# Patient Record
Sex: Male | Born: 1995 | Race: White | Hispanic: No | Marital: Single | Smoking: Light tobacco smoker
Health system: Southern US, Community
[De-identification: ages and names within clinical notes are randomized; demographics above are authoritative.]

## PROBLEM LIST (undated history)

## (undated) DIAGNOSIS — K219 Gastro-esophageal reflux disease without esophagitis: Secondary | ICD-10-CM

## (undated) DIAGNOSIS — K589 Irritable bowel syndrome without diarrhea: Secondary | ICD-10-CM

## (undated) DIAGNOSIS — F419 Anxiety disorder, unspecified: Secondary | ICD-10-CM

## (undated) DIAGNOSIS — I10 Essential (primary) hypertension: Secondary | ICD-10-CM

## (undated) HISTORY — PX: FEMUR SURGERY: SHX943

## (undated) HISTORY — PX: WISDOM TOOTH EXTRACTION: SHX21

## (undated) HISTORY — PX: FEMUR HARDWARE REMOVAL: SUR1124

## (undated) HISTORY — PX: FRACTURE SURGERY: SHX138

## (undated) HISTORY — PX: APPENDECTOMY: SHX54

## (undated) HISTORY — DX: Essential (primary) hypertension: I10

---
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
## 2015-10-29 ENCOUNTER — Observation Stay
Admission: EM | Admit: 2015-10-29 | Discharge: 2015-10-30 | Disposition: A | Discharge status: Home / Self Care | Payer: BLUE CROSS/BLUE SHIELD | Attending: General Surgery | Admitting: General Surgery

## 2015-10-29 ENCOUNTER — Emergency Department: Payer: BLUE CROSS/BLUE SHIELD

## 2015-10-29 ENCOUNTER — Encounter: Payer: Self-pay | Admitting: Emergency Medicine

## 2015-10-29 DIAGNOSIS — K3580 Unspecified acute appendicitis: Secondary | ICD-10-CM | POA: Diagnosis present

## 2015-10-29 DIAGNOSIS — R1010 Upper abdominal pain, unspecified: Principal | ICD-10-CM | POA: Insufficient documentation

## 2015-10-29 DIAGNOSIS — R1012 Left upper quadrant pain: Secondary | ICD-10-CM | POA: Diagnosis not present

## 2015-10-29 DIAGNOSIS — R109 Unspecified abdominal pain: Secondary | ICD-10-CM | POA: Diagnosis present

## 2015-10-29 LAB — URINALYSIS COMPLETE WITH MICROSCOPIC (ARMC ONLY)
BILIRUBIN URINE: NEGATIVE
Bacteria, UA: NONE SEEN
GLUCOSE, UA: NEGATIVE mg/dL
HGB URINE DIPSTICK: NEGATIVE
KETONES UR: NEGATIVE mg/dL
Leukocytes, UA: NEGATIVE
NITRITE: NEGATIVE
Protein, ur: NEGATIVE mg/dL
SPECIFIC GRAVITY, URINE: 1.016 (ref 1.005–1.030)
Squamous Epithelial / LPF: NONE SEEN
pH: 7 (ref 5.0–8.0)

## 2015-10-29 LAB — CBC WITH DIFFERENTIAL/PLATELET
Basophils Absolute: 0 10*3/uL (ref 0–0.1)
Basophils Relative: 0 %
EOS ABS: 0.1 10*3/uL (ref 0–0.7)
EOS PCT: 1 %
HCT: 49.4 % (ref 40.0–52.0)
Hemoglobin: 16.4 g/dL (ref 13.0–18.0)
LYMPHS ABS: 1.4 10*3/uL (ref 1.0–3.6)
Lymphocytes Relative: 13 %
MCH: 28.5 pg (ref 26.0–34.0)
MCHC: 33.2 g/dL (ref 32.0–36.0)
MCV: 85.6 fL (ref 80.0–100.0)
MONOS PCT: 5 %
Monocytes Absolute: 0.5 10*3/uL (ref 0.2–1.0)
Neutro Abs: 9 10*3/uL — ABNORMAL HIGH (ref 1.4–6.5)
Neutrophils Relative %: 81 %
PLATELETS: 263 10*3/uL (ref 150–440)
RBC: 5.77 MIL/uL (ref 4.40–5.90)
RDW: 13.5 % (ref 11.5–14.5)
WBC: 11.1 10*3/uL — ABNORMAL HIGH (ref 3.8–10.6)

## 2015-10-29 LAB — COMPREHENSIVE METABOLIC PANEL
ALK PHOS: 64 U/L (ref 38–126)
ALT: 27 U/L (ref 17–63)
ANION GAP: 7 (ref 5–15)
AST: 19 U/L (ref 15–41)
Albumin: 4.8 g/dL (ref 3.5–5.0)
BUN: 10 mg/dL (ref 6–20)
CALCIUM: 9.7 mg/dL (ref 8.9–10.3)
CHLORIDE: 103 mmol/L (ref 101–111)
CO2: 29 mmol/L (ref 22–32)
Creatinine, Ser: 0.93 mg/dL (ref 0.61–1.24)
GFR calc non Af Amer: >60 mL/min (ref >60)
Glucose, Bld: 93 mg/dL (ref 65–99)
Potassium: 3.9 mmol/L (ref 3.5–5.1)
SODIUM: 139 mmol/L (ref 135–145)
Total Bilirubin: 0.7 mg/dL (ref 0.3–1.2)
Total Protein: 8.6 g/dL — ABNORMAL HIGH (ref 6.5–8.1)

## 2015-10-29 LAB — LIPASE, BLOOD: LIPASE: 23 U/L (ref 11–51)

## 2015-10-29 MED ORDER — DIPHENHYDRAMINE HCL 25 MG PO CAPS: 25.0000 mg | ORAL_CAPSULE | Freq: Four times a day (QID) | ORAL | Status: DC | PRN

## 2015-10-29 MED ORDER — ONDANSETRON HCL 4 MG/2ML IJ SOLN: 4.0000 mg | Freq: Four times a day (QID) | INTRAMUSCULAR | Status: DC | PRN

## 2015-10-29 MED ORDER — MORPHINE SULFATE (PF) 4 MG/ML IV SOLN: 4.0000 mg | INTRAVENOUS | Status: DC | PRN

## 2015-10-29 MED ORDER — LACTATED RINGERS IV SOLN
INTRAVENOUS | Status: DC
  Administered 2015-10-29 – 2015-10-30 (×2): via INTRAVENOUS

## 2015-10-29 MED ORDER — ONDANSETRON 4 MG PO TBDP: 4.0000 mg | ORAL_TABLET | Freq: Four times a day (QID) | ORAL | Status: DC | PRN

## 2015-10-29 MED ORDER — DIPHENHYDRAMINE HCL 50 MG/ML IJ SOLN: 25.0000 mg | Freq: Four times a day (QID) | INTRAMUSCULAR | Status: DC | PRN

## 2015-10-29 NOTE — ED Notes (Signed)
Patient transported to CT 
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
## 2015-10-29 NOTE — ED Notes (Signed)
Pt with co upper abd pain, no n.v.d

## 2015-10-29 NOTE — ED Notes (Signed)
Upper abd pain/cramping. Denies nausea or vomiting.

## 2015-10-29 NOTE — ED Provider Notes (Signed)
[facility] Emergency Department Provider Note  ____________________________________________  Time seen: On arrival  I have reviewed the triage vital signs and the nursing notes.   HISTORY  Chief Complaint Abdominal Pain    HPI Johnny Stein is a 19 y.o. male who presents with left upper quadrant abdominal pain which started approximately 2-3 hours prior to arrival. He reports abrupt cramping discomfort which is now mostly resolved. He denies fevers chills. No nausea no vomiting. No diarrhea. No history of kidney stones. No scrotal pain. He has not taken anything for the pain and it has improved significantly     No past medical history on file.  There are no active problems to display for this patient.   No past surgical history on file.  No current outpatient prescriptions on file.  Allergies Review of patient's allergies indicates no known allergies.  No family history on file.  Social History Social History  Substance Use Topics  . Smoking status: Not on file  . Smokeless tobacco: Not on file  . Alcohol Use: Not on file    Review of Systems  Constitutional: Negative for fever. Eyes: Negative for visual changes. ENT: Negative for sore throat Cardiovascular: Negative for chest pain. Respiratory: Negative for shortness of breath. Gastrointestinal: Negative for vomiting and diarrhea. Genitourinary: Negative for dysuria. Musculoskeletal: Negative for back pain. Skin: Negative for rash. Neurological: Negative for headaches or focal weakness Psychiatric: Mild anxiety    ____________________________________________   PHYSICAL EXAM:  VITAL SIGNS: ED Triage Vitals  Enc Vitals Group     BP 10/29/15 1930 153/88 mmHg     Pulse Rate 10/29/15 1930 67     Resp 10/29/15 1930 18     Temp 10/29/15 1930 98.6 F (37 C)     Temp Source 10/29/15 1930 Oral     SpO2 10/29/15 1930 100 %     Weight 10/29/15 1930 245 lb (111.131 kg)   Height 10/29/15 1930 6' (1.829 m)     Head Cir --      Peak Flow --      Pain Score 10/29/15 1930 5     Pain Loc --      Pain Edu? --      Excl. in GC? --      Constitutional: Alert and oriented. Well appearing and in no distress. Eyes: Conjunctivae are normal.  ENT   Head: Normocephalic and atraumatic.   Mouth/Throat: Mucous membranes are moist. Cardiovascular: Normal rate, regular rhythm. Normal and symmetric distal pulses are present in all extremities. No murmurs, rubs, or gallops. Respiratory: Normal respiratory effort without tachypnea nor retractions. Breath sounds are clear and equal bilaterally.  Gastrointestinal: Soft and non-tender in all quadrants. No distention. There is no CVA tenderness. Genitourinary: deferred Musculoskeletal: Nontender with normal range of motion in all extremities. No lower extremity tenderness nor edema. Neurologic:  Normal speech and language. No gross focal neurologic deficits are appreciated. Skin:  Skin is warm, dry and intact. No rash noted. Psychiatric: Mood and affect are normal. Patient exhibits appropriate insight and judgment.  ____________________________________________    LABS (pertinent positives/negatives)  Labs Reviewed  CBC WITH DIFFERENTIAL/PLATELET - Abnormal; Notable for the following:    WBC 11.1 (*)    Neutro Abs 9.0 (*)    All other components within normal limits  COMPREHENSIVE METABOLIC PANEL - Abnormal; Notable for the following:    Total Protein 8.6 (*)    All other components within normal limits  URINALYSIS COMPLETEWITH MICROSCOPIC [facility]  ONLY) - Abnormal; Notable for the following:    Color, Urine YELLOW (*)    APPearance CLEAR (*)    All other components within normal limits  LIPASE, BLOOD    ____________________________________________   EKG  None  ____________________________________________    RADIOLOGY I have personally reviewed any xrays that were ordered on this patient: CT abdomen  pelvis concerning for appendicitis  ____________________________________________   PROCEDURES  Procedure(s) performed: none  Critical Care performed: none  ____________________________________________   INITIAL IMPRESSION / ASSESSMENT AND PLAN / ED COURSE  Pertinent labs & imaging results that were available during my care of the patient were reviewed by me and considered in my medical decision making (see chart for details).  Patient overall well-appearing with benign exam. Differential includes gastritis, pancreatitis, nonspecific abdominal cramping, kidney stones. We'll check CT renal stone study and labs and urine and reevaluate  Despite location of patient's pain, CT abdomen pelvis is concerning for appendicitis. I have consulted Dr. Tonita Cong of general surgery to evaluate the patient.  ____________________________________________   FINAL CLINICAL IMPRESSION(S) / ED DIAGNOSES  Final diagnoses:  Flank pain, acute  Acute appendicitis, unspecified acute appendicitis type     Jene Every, MD 10/29/15 2213

## 2015-10-30 DIAGNOSIS — R1012 Left upper quadrant pain: Secondary | ICD-10-CM

## 2015-10-30 LAB — BASIC METABOLIC PANEL
Anion gap: 3 — ABNORMAL LOW (ref 5–15)
BUN: 11 mg/dL (ref 6–20)
CHLORIDE: 106 mmol/L (ref 101–111)
CO2: 32 mmol/L (ref 22–32)
CREATININE: 1.09 mg/dL (ref 0.61–1.24)
Calcium: 9 mg/dL (ref 8.9–10.3)
GFR calc Af Amer: >60 mL/min (ref >60)
GFR calc non Af Amer: >60 mL/min (ref >60)
GLUCOSE: 99 mg/dL (ref 65–99)
Potassium: 3.2 mmol/L — ABNORMAL LOW (ref 3.5–5.1)
Sodium: 141 mmol/L (ref 135–145)

## 2015-10-30 LAB — CBC
HEMATOCRIT: 45.8 % (ref 40.0–52.0)
HEMOGLOBIN: 15.3 g/dL (ref 13.0–18.0)
MCH: 28.3 pg (ref 26.0–34.0)
MCHC: 33.3 g/dL (ref 32.0–36.0)
MCV: 84.9 fL (ref 80.0–100.0)
Platelets: 215 10*3/uL (ref 150–440)
RBC: 5.39 MIL/uL (ref 4.40–5.90)
RDW: 13.4 % (ref 11.5–14.5)
WBC: 9.1 10*3/uL (ref 3.8–10.6)

## 2015-10-30 NOTE — Discharge Summary (Signed)
Patient ID: Dickey GaveMichael D Kohlbeck MRN: [id number] DOB/AGE: 20-Apr-1996 20 y.o.  Admit date: 10/29/2015 Discharge date: 10/30/2015  Discharge Diagnoses:  Abdominal pain  Procedures Performed: None  Discharged Condition: good  Hospital Course: Patient placed in observation from the emergency department for evaluation of abdominal pain. Patient's pain resolved and labs normalized overnight. No evidence of appendicitis on physical exam. Able tolerate a regular diet prior to discharge.  Discharge Orders:  discharge home. Should symptoms recur and not spontaneously resolve please contact the clinic. If unable to handle the pain after hours report to emergency department.  Disposition: Discharge home  Discharge Medications: No new medications for discharge   Follwup: Follow-up Information    Schedule an appointment as needed for a [facility] [place].   Specialty:  General Surgery   Why:  As needed for recurrent symptoms    Contact information:   [address], [address] [place] [id number] [phone]      Signed: Ricarda FrameCharles Harneet Noblett 10/30/2015, 12:23 PM

## 2015-10-30 NOTE — Progress Notes (Signed)
Alert and oriented. Vss. No signs of acute distress. No nausea and vomiting. Denies pain and discomfort. Discharge instructions given. Patient verbalized understanding.

## 2015-10-30 NOTE — Progress Notes (Addendum)
Patient A/O, no noted distress. Patient noted his pain is at a four, "no I don't anything for pain."  No noted open areas on skin. LBM 10/29/15. Staff will continue to meet needs and monitor. Dr. Tonita CongWoodham noted, if patient tolerates meal he will be discharge.

## 2015-10-30 NOTE — Discharge Instructions (Signed)

## 2015-10-30 NOTE — Progress Notes (Signed)
CC: Abdominal pain Subjective: Patient reports that his pain did not return overnight. Denies any nausea, vomiting. He slept through the night.  Objective: Vital signs in last 24 hours: Temp:  [97.4 F (36.3 C)-98.6 F (37 C)] 97.4 F (36.3 C) (12/16 0529) Pulse Rate:  [59-89] 59 (12/16 0529) Resp:  [16-18] 16 (12/16 0529) BP: (131-155)/(64-96) 139/68 mmHg (12/16 0529) SpO2:  [92 %-100 %] 99 % (12/16 0529) Weight:  [111.131 kg (245 lb)] 111.131 kg (245 lb) (12/15 1930) Last BM Date: 10/29/15  Intake/Output from previous day: 12/15 0701 - 12/16 0700 In: 647.9 [P.O.:125; I.V.:522.9] Out: -  Intake/Output this shift: Total I/O In: 647.9 [P.O.:125; I.V.:522.9] Out: -   Physical exam:  Gen.: No acute distress Chest: Clear to auscultation Heart: Regular rate and rhythm Abdomen: Soft, nontender, nondistended.  Lab Results: CBC   Recent Labs  10/29/15 1933 10/30/15 0409  WBC 11.1* 9.1  HGB 16.4 15.3  HCT 49.4 45.8  PLT 263 215   BMET  Recent Labs  10/29/15 1933 10/30/15 0409  NA 139 141  K 3.9 3.2*  CL 103 106  CO2 29 32  GLUCOSE 93 99  BUN 10 11  CREATININE 0.93 1.09  CALCIUM 9.7 9.0   PT/INR No results for input(s): LABPROT, INR in the last 72 hours. ABG No results for input(s): PHART, HCO3 in the last 72 hours.  Invalid input(s): PCO2, PO2  Studies/Results: Ct Renal Stone Study  10/29/2015  CLINICAL DATA:  Acute upper abdominal pain. EXAM: CT ABDOMEN AND PELVIS WITHOUT CONTRAST TECHNIQUE: Multidetector CT imaging of the abdomen and pelvis was performed following the standard protocol without IV contrast. COMPARISON:  CT scan of October 09, 2007. FINDINGS: Visualized lung bases are unremarkable. No significant osseous abnormality is noted. No gallstones are noted. No focal abnormality is noted in the liver, spleen or pancreas on these unenhanced images. Adrenal glands and kidneys appear normal. No hydronephrosis or renal obstruction is noted. No renal  or ureteral calculi are noted. There is no evidence of bowel obstruction. The appendix is enlarged with mild surrounding inflammation consistent with acute appendicitis. No abnormal fluid collection is noted. Urinary bladder appears normal. No significant adenopathy is noted. IMPRESSION: Appendix is enlarged with surrounding inflammation consistent with acute appendicitis. Electronically Signed   By: Lupita RaiderJames  Green Jr, M.D.   On: 10/29/2015 21:22    Anti-infectives: Anti-infectives    None      Assessment/Plan:  19 year old male admitted for abdominal pain. Pain has stayed away labs have normalized. We'll provide with regular diet this morning as a by mouth challenge. If tolerates a regular diet, he will be able to be discharged  Leonette Mostharles T. Tonita CongWoodham, MD, FACS  10/30/2015

## 2015-10-30 NOTE — Final Progress Note (Signed)
CC: Abdominal pain Subjective: Abdominal pain is completely resolved. Patient is hungry and able tolerate a diet. Has had large amounts of flatus.  Objective: Vital signs in last 24 hours: Temp:  [97.4 F (36.3 C)-98.6 F (37 C)] 97.4 F (36.3 C) (12/16 0529) Pulse Rate:  [59-89] 59 (12/16 0529) Resp:  [16-18] 16 (12/16 0529) BP: (131-155)/(64-96) 139/68 mmHg (12/16 0529) SpO2:  [92 %-100 %] 99 % (12/16 0529) Weight:  [111.131 kg (245 lb)-112.9 kg (248 lb 14.4 oz)] 112.9 kg (248 lb 14.4 oz) (12/15 2312) Last BM Date: 10/29/15  Intake/Output from previous day: 12/15 0701 - 12/16 0700 In: 647.9 [P.O.:125; I.V.:522.9] Out: -  Intake/Output this shift:    Physical exam:  Gen.: No acute distress  chest: Clear to auscultation Heart: Regular rate and rhythm  abdomen: Soft, nontender, nondistended  Lab Results: CBC   Recent Labs  10/29/15 1933 10/30/15 0409  WBC 11.1* 9.1  HGB 16.4 15.3  HCT 49.4 45.8  PLT 263 215   BMET  Recent Labs  10/29/15 1933 10/30/15 0409  NA 139 141  K 3.9 3.2*  CL 103 106  CO2 29 32  GLUCOSE 93 99  BUN 10 11  CREATININE 0.93 1.09  CALCIUM 9.7 9.0   PT/INR No results for input(s): LABPROT, INR in the last 72 hours. ABG No results for input(s): PHART, HCO3 in the last 72 hours.  Invalid input(s): PCO2, PO2  Studies/Results: Ct Renal Stone Study  10/29/2015  CLINICAL DATA:  Acute upper abdominal pain. EXAM: CT ABDOMEN AND PELVIS WITHOUT CONTRAST TECHNIQUE: Multidetector CT imaging of the abdomen and pelvis was performed following the standard protocol without IV contrast. COMPARISON:  CT scan of October 09, 2007. FINDINGS: Visualized lung bases are unremarkable. No significant osseous abnormality is noted. No gallstones are noted. No focal abnormality is noted in the liver, spleen or pancreas on these unenhanced images. Adrenal glands and kidneys appear normal. No hydronephrosis or renal obstruction is noted. No renal or ureteral  calculi are noted. There is no evidence of bowel obstruction. The appendix is enlarged with mild surrounding inflammation consistent with acute appendicitis. No abnormal fluid collection is noted. Urinary bladder appears normal. No significant adenopathy is noted. IMPRESSION: Appendix is enlarged with surrounding inflammation consistent with acute appendicitis. Electronically Signed   By: Lupita RaiderJames  Green Jr, M.D.   On: 10/29/2015 21:22    Anti-infectives: Anti-infectives    None      Assessment/Plan:  19 year old male admitted for observation to rule out appendicitis. Pain resolved and labs normalized. No evidence of appendicitis clinically. Discussed with patient and mother that this is likely a viral gastroenteritis. Should his symptoms recur and not go away he is to contact the clinic for follow-up or the emergency department if he is unable to make it to clinic.  Johnny Stebner T. Tonita CongWoodham, MD, FACS  10/30/2015

## 2015-11-28 ENCOUNTER — Emergency Department: Payer: BLUE CROSS/BLUE SHIELD

## 2015-11-28 ENCOUNTER — Emergency Department
Admission: EM | Admit: 2015-11-28 | Discharge: 2015-11-28 | Disposition: A | Discharge status: Home / Self Care | Payer: BLUE CROSS/BLUE SHIELD | Attending: Emergency Medicine | Admitting: Emergency Medicine

## 2015-11-28 DIAGNOSIS — K2970 Gastritis, unspecified, without bleeding: Secondary | ICD-10-CM | POA: Diagnosis not present

## 2015-11-28 DIAGNOSIS — F17210 Nicotine dependence, cigarettes, uncomplicated: Secondary | ICD-10-CM | POA: Diagnosis not present

## 2015-11-28 DIAGNOSIS — R1011 Right upper quadrant pain: Secondary | ICD-10-CM | POA: Diagnosis present

## 2015-11-28 LAB — COMPREHENSIVE METABOLIC PANEL
ALBUMIN: 4.5 g/dL (ref 3.5–5.0)
ALT: 24 U/L (ref 17–63)
ANION GAP: 5 (ref 5–15)
AST: 18 U/L (ref 15–41)
Alkaline Phosphatase: 60 U/L (ref 38–126)
BILIRUBIN TOTAL: 0.7 mg/dL (ref 0.3–1.2)
BUN: 11 mg/dL (ref 6–20)
CHLORIDE: 105 mmol/L (ref 101–111)
CO2: 28 mmol/L (ref 22–32)
Calcium: 9 mg/dL (ref 8.9–10.3)
Creatinine, Ser: 1.03 mg/dL (ref 0.61–1.24)
GFR calc Af Amer: >60 mL/min (ref >60)
GFR calc non Af Amer: >60 mL/min (ref >60)
GLUCOSE: 96 mg/dL (ref 65–99)
POTASSIUM: 3.8 mmol/L (ref 3.5–5.1)
SODIUM: 138 mmol/L (ref 135–145)
TOTAL PROTEIN: 7.7 g/dL (ref 6.5–8.1)

## 2015-11-28 LAB — CBC
HEMATOCRIT: 48.5 % (ref 40.0–52.0)
HEMOGLOBIN: 16 g/dL (ref 13.0–18.0)
MCH: 27.7 pg (ref 26.0–34.0)
MCHC: 33 g/dL (ref 32.0–36.0)
MCV: 83.9 fL (ref 80.0–100.0)
Platelets: 226 10*3/uL (ref 150–440)
RBC: 5.78 MIL/uL (ref 4.40–5.90)
RDW: 13.3 % (ref 11.5–14.5)
WBC: 8.6 10*3/uL (ref 3.8–10.6)

## 2015-11-28 LAB — URINALYSIS COMPLETE WITH MICROSCOPIC (ARMC ONLY)
BACTERIA UA: NONE SEEN
Bilirubin Urine: NEGATIVE
Glucose, UA: NEGATIVE mg/dL
KETONES UR: NEGATIVE mg/dL
Leukocytes, UA: NEGATIVE
NITRITE: NEGATIVE
PH: 5 (ref 5.0–8.0)
PROTEIN: NEGATIVE mg/dL
SPECIFIC GRAVITY, URINE: 1.026 (ref 1.005–1.030)
Squamous Epithelial / LPF: NONE SEEN

## 2015-11-28 LAB — LIPASE, BLOOD: LIPASE: 24 U/L (ref 11–51)

## 2015-11-28 MED ORDER — ONDANSETRON 4 MG PO TBDP
4.0000 mg | ORAL_TABLET | Freq: Once | ORAL | Status: AC | PRN
  Administered 2015-11-28: 4 mg via ORAL
  Filled 2015-11-28: qty 1

## 2015-11-28 MED ORDER — GI COCKTAIL ~~LOC~~
30.0000 mL | Freq: Once | ORAL | Status: AC
  Administered 2015-11-28: 30 mL via ORAL
  Filled 2015-11-28: qty 30

## 2015-11-28 NOTE — ED Notes (Signed)
Pt reports pain to his abd. Intermittently for the last few days worse this morning. . States he was seen here last week and admitted for an inflamed appendix that improved so he elected to not have it removed. States this is same pain as before.

## 2015-11-28 NOTE — Discharge Instructions (Signed)
You were evaluated for upper abdominal pain, which we discussed her exam and evaluation are reassuring in the emergency department. I am most suspicious that your having gastritis/GERD and I want you to try over-the-counter Zantac 150 mg daily for 2 weeks to help reduce stomach acid like her stomach heels. Avoid fatty, spicy, caffeine, and soda while your stomach is healing.   We discussed return to the emergency department for any worsening condition including black or bloody stools, bloody vomitus, fever, lower abdominal pain especially any pain in the right lower abdomen, or any worsening abdominal pain.  We discussed I recommend follow-up with primary care physician this coming week, and make an appoint with a gastroenterologist for next available appointment with multiple episodes of abdominal pain.   Abdominal Pain, Adult Many things can cause belly (abdominal) pain. Most times, the belly pain is not dangerous. Many cases of belly pain can be watched and treated at home. HOME CARE   Do not take medicines that help you go poop (laxatives) unless told to by your doctor.  Only take medicine as told by your doctor.  Eat or drink as told by your doctor. Your doctor will tell you if you should be on a special diet. GET HELP IF:  You do not know what is causing your belly pain.  You have belly pain while you are sick to your stomach (nauseous) or have runny poop (diarrhea).  You have pain while you pee or poop.  Your belly pain wakes you up at night.  You have belly pain that gets worse or better when you eat.  You have belly pain that gets worse when you eat fatty foods.  You have a fever. GET HELP RIGHT AWAY IF:   The pain does not go away within 2 hours.  You keep throwing up (vomiting).  The pain changes and is only in the right or left part of the belly.  You have bloody or tarry looking poop. MAKE SURE YOU:   Understand these instructions.  Will watch your  condition.  Will get help right away if you are not doing well or get worse.   This information is not intended to replace advice given to you by your health care provider. Make sure you discuss any questions you have with your health care provider.   Document Released: 04/18/2008 Document Revised: 11/21/2014 Document Reviewed: 07/10/2013 Elsevier Interactive Patient Education 2016 [employer]  Gastritis, Adult Gastritis is soreness and swelling (inflammation) of the lining of the stomach. Gastritis can develop as a sudden onset (acute) or long-term (chronic) condition. If gastritis is not treated, it can lead to stomach bleeding and ulcers. CAUSES  Gastritis occurs when the stomach lining is weak or damaged. Digestive juices from the stomach then inflame the weakened stomach lining. The stomach lining may be weak or damaged due to viral or bacterial infections. One common bacterial infection is the Helicobacter pylori infection. Gastritis can also result from excessive alcohol consumption, taking certain medicines, or having too much acid in the stomach.  SYMPTOMS  In some cases, there are no symptoms. When symptoms are present, they may include:  Pain or a burning sensation in the upper abdomen.  Nausea.  Vomiting.  An uncomfortable feeling of fullness after eating. DIAGNOSIS  Your caregiver may suspect you have gastritis based on your symptoms and a physical exam. To determine the cause of your gastritis, your caregiver may perform the following:  Blood or stool tests to check for the  H pylori bacterium.  Gastroscopy. A thin, flexible tube (endoscope) is passed down the esophagus and into the stomach. The endoscope has a light and camera on the end. Your caregiver uses the endoscope to view the inside of the stomach.  Taking a tissue sample (biopsy) from the stomach to examine under a microscope. TREATMENT  Depending on the cause of your gastritis, medicines may be prescribed.  If you have a bacterial infection, such as an H pylori infection, antibiotics may be given. If your gastritis is caused by too much acid in the stomach, H2 blockers or antacids may be given. Your caregiver may recommend that you stop taking aspirin, ibuprofen, or other nonsteroidal anti-inflammatory drugs (NSAIDs). HOME CARE INSTRUCTIONS  Only take over-the-counter or prescription medicines as directed by your caregiver.  If you were given antibiotic medicines, take them as directed. Finish them even if you start to feel better.  Drink enough fluids to keep your urine clear or pale yellow.  Avoid foods and drinks that make your symptoms worse, such as:  Caffeine or alcoholic drinks.  Chocolate.  Peppermint or mint flavorings.  Garlic and onions.  Spicy foods.  Citrus fruits, such as oranges, lemons, or limes.  Tomato-based foods such as sauce, chili, salsa, and pizza.  Fried and fatty foods.  Eat small, frequent meals instead of large meals. SEEK IMMEDIATE MEDICAL CARE IF:   You have black or dark red stools.  You vomit blood or material that looks like coffee grounds.  You are unable to keep fluids down.  Your abdominal pain gets worse.  You have a fever.  You do not feel better after 1 week.  You have any other questions or concerns. MAKE SURE YOU:  Understand these instructions.  Will watch your condition.  Will get help right away if you are not doing well or get worse.   This information is not intended to replace advice given to you by your health care provider. Make sure you discuss any questions you have with your health care provider.   Document Released: 10/25/2001 Document Revised: 05/01/2012 Document Reviewed: 12/14/2011 Elsevier Interactive Patient Education [employer]

## 2015-11-28 NOTE — ED Provider Notes (Signed)
[facility] Emergency Department Provider Note   ____________________________________________  Time seen: Approximately 9 AM I have reviewed the triage vital signs and the triage nursing note.  HISTORY  Chief Complaint Abdominal Pain   Historian Patient, mom  HPI Johnny Stein is a 20 y.o. male with a history of GERD, and possible irritable bowel disease, who is here with waxing and waning upper abdominal pain since yesterday. 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 No previous diagnosis of gallstones, but family is questioning whether or not he may have gallbladder pain. Patient reports onset of upper abdominal pain yesterday that wraps around from the side to the Center and feels like a burning. It does wax and wane. At worst it was moderate to severe and currently is mild. No lower abdominal pain. He was seen in December for similar pains but at that time the pain was lower and he was having vomiting and diarrhea. He had a CT scan that showed appendicitis, but was observed overnight with the surgery team who did not feel like he was acute appendicitis. That episode resolved. He has had other episodes similar to this in the past.  No fever. No vomiting or diarrhea this time. He is unclear if it's associated with food specifically. He's never seen a gastroenterologist.    No past medical history on file.  Patient Active Problem List   Diagnosis Date Noted  . Abdominal pain 10/29/2015    No past surgical history on file.  Current Outpatient Rx  Name  Route  Sig  Dispense  Refill  . ranitidine (ZANTAC) 150 MG tablet   Oral   Take 150 mg by mouth 2 (two) times daily as needed for heartburn.           Allergies Review of patient's allergies indicates no known allergies.  Family History  Problem Relation Age of Onset  . Hypertension Mother   .  Diabetes Father     Social History Social History  Substance Use Topics  . Smoking status: Light Tobacco Smoker    Types: Cigarettes  . Smokeless tobacco: Never Used  . Alcohol Use: 0.6 oz/week    1 Shots of liquor per week    Review of Systems  Constitutional: Negative for fever. Eyes: Negative for visual changes. ENT: Negative for sore throat. Cardiovascular: Negative for chest pain. Respiratory: Negative for shortness of breath. Gastrointestinal: Negative for vomiting and diarrhea. Genitourinary: Negative for dysuria. Musculoskeletal: Negative for back pain. Skin: Negative for rash. Neurological: Negative for headache. 10 point Review of Systems otherwise negative ____________________________________________   PHYSICAL EXAM:  VITAL SIGNS: ED Triage Vitals  Enc Vitals Group     BP 11/28/15 0633 131/87 mmHg     Pulse Rate 11/28/15 0633 77     Resp 11/28/15 0633 20     Temp 11/28/15 0633 97.5 F (36.4 C)     Temp Source 11/28/15 0633 Oral     SpO2 11/28/15 0633 100 %     Weight 11/28/15 0633 248 lb (112.492 kg)     Height 11/28/15 0633 6' (1.829 m)     Head Cir --      Peak Flow --      Pain Score 11/28/15 0632 8     Pain Loc --      Pain Edu? --      Excl. in GC? --  Constitutional: Alert and oriented. Well appearing and in no distress. Eyes: Conjunctivae are normal. PERRL. Normal extraocular movements. ENT   Head: Normocephalic and atraumatic.   Nose: No congestion/rhinnorhea.   Mouth/Throat: Mucous membranes are moist.   Neck: No stridor. Cardiovascular/Chest: Normal rate, regular rhythm.  No murmurs, rubs, or gallops. Respiratory: Normal respiratory effort without tachypnea nor retractions. Breath sounds are clear and equal bilaterally. No wheezes/rales/rhonchi. Gastrointestinal: Soft. No distention, no guarding, no rebound.  Mild epigastric right upper quadrant and left upper quadrant tenderness to palpation. No lower abdominal  tenderness palpation over the suprapubic, left lower quadrant or right lower quadrant.  Genitourinary/rectal:Deferred Musculoskeletal: Nontender with normal range of motion in all extremities. No joint effusions.  No lower extremity tenderness.  No edema. Neurologic:  Normal speech and language. No gross or focal neurologic deficits are appreciated. Skin:  Skin is warm, dry and intact. No rash noted. Psychiatric: Mood and affect are normal. Speech and behavior are normal. Patient exhibits appropriate insight and judgment.  ____________________________________________   EKG I, Governor Rooksebecca Malekai Markwood, MD, the attending physician have personally viewed and interpreted all ECGs.  None ____________________________________________  LABS (pertinent positives/negatives)  Urinalysis negative Conference metabolic panel without significant abnormality Lipase 24 CBC within normal limits including a white blood count 8.6.  ____________________________________________  RADIOLOGY All Xrays were viewed by me. Imaging interpreted by Radiologist.  Ultrasound right upper quadrant: Within normal limits __________________________________________  PROCEDURES  Procedure(s) performed: None  Critical Care performed: None  ____________________________________________   ED COURSE / ASSESSMENT AND PLAN  CONSULTATIONS: None  Pertinent labs & imaging results that were available during my care of the patient were reviewed by me and considered in my medical decision making (see chart for details).   Today this patient's symptoms seemed likely to be either gastritis versus biliary colic versus irritable bowel. His laboratory studies are reassuring for no pancreatitis, normal conference metabolic panel including LFTs, and normal white blood cell count. He has no abdominal pain in the lower abdomen and I'm not suspicious for acute appendicitis or intra-abdominal emergency clinically.  He does have a poor diet  in that he eats [employer] most the time for his meals. I discussed with him that this is not likely be helpful for GERD, biliary colic or irritable bowel.  We discussed that if he is having continued intermittent abdominal pains, the next step would be for him to see a gastrologist. I will provide him the office referral number for this.  Patient / Family / Caregiver informed of clinical course, medical decision-making process, and agree with plan.   I discussed return precautions, follow-up instructions, and discharged instructions with patient and/or family.  ___________________________________________   FINAL CLINICAL IMPRESSION(S) / ED DIAGNOSES   Final diagnoses:  RUQ pain  Gastritis              Note: This dictation was prepared with Dragon dictation. Any transcriptional errors that result from this process are unintentional   Governor Rooksebecca Jordin Dambrosio, MD 11/28/15 1214

## 2015-11-28 NOTE — ED Notes (Signed)
Abdominal pain started last night that waxes and wanes.  Pt c/o cramping. Last drank gingerale around 430 am. Pt was given the option to have appendix out but pain resolved. Pt has been bad at time it doubles him over. Denies any fevers.

## 2015-11-28 NOTE — ED Notes (Signed)
MD at bedside with pt

## 2016-02-11 ENCOUNTER — Encounter: Payer: Self-pay | Admitting: Podiatry

## 2016-02-11 ENCOUNTER — Ambulatory Visit (INDEPENDENT_AMBULATORY_CARE_PROVIDER_SITE_OTHER): Payer: BLUE CROSS/BLUE SHIELD | Admitting: Podiatry

## 2016-02-11 VITALS — BP 137/66 | HR 68 | Resp 18

## 2016-02-11 DIAGNOSIS — L600 Ingrowing nail: Secondary | ICD-10-CM | POA: Insufficient documentation

## 2016-02-11 MED ORDER — CEPHALEXIN 500 MG PO CAPS: 500.0000 mg | ORAL_CAPSULE | Freq: Three times a day (TID) | ORAL | Status: DC

## 2016-02-11 NOTE — Progress Notes (Signed)
   Subjective:    Patient ID: Johnny Stein, male    DOB: [birth year], 20 y.o.   MRN: [id number]  HPI  20 year old male presents the also concerns of ingrown toenail to left big toe is been ongoing for approximate one month. He has got some clear drainage coming from the nail border. Is also been red and swollen around the nail denies a red streaks. Denies any pus. He is tried to the ingrown toenail himself to any relief. No other complaints at this time.   Review of Systems  All other systems reviewed and are negative.      Objective:   Physical Exam General: AAO x3, NAD  Dermatological: There is evidence of induration along the lateral aspect of the left hallux toenail tenderness palpation over on this area. There is localized edema and erythema that any before meals cellulitis. There is no drainage or pus expressed today. No tenderness of the medial nail border. No tenderness remaining toenails.  Vascular: DP/PT pulses 2/4, CRT less than 3 seconds, pedal hair present.  There is no pain with calf compression, swelling, warmth, erythema.   Neruologic: Grossly intact via light touch bilateral. Vibratory intact via tuning fork bilateral. Protective threshold with Semmes Wienstein monofilament intact to all pedal sites bilateral. Patellar and Achilles deep tendon reflexes 2+ bilateral. No Babinski or clonus noted bilateral.   Musculoskeletal: No gross boney pedal deformities bilateral. No pain, crepitus, or limitation noted with foot and ankle range of motion bilateral. Muscular strength 5/5 in all groups tested bilateral.  Gait: Unassisted, Nonantalgic.       Assessment & Plan:   20 year old male left lateral hallux ingrown toenail  -Treatment options discussed including all alternatives, risks, and complications -Etiology of symptoms were discussed At this time, the patient is requesting partial nail removal with chemical matricectomy to the symptomatic portion of the nail. Risks  and complications were discussed with the patient for which they understand and  verbally consent to the procedure. Under sterile conditions a total of 3 mL of a mixture of 2% lidocaine plain and 0.5% Marcaine plain was infiltrated in a hallux block fashion. Once anesthetized, the skin was prepped in sterile fashion. A tourniquet was then applied. Next the lateral aspect of hallux nail border was then sharply excised making sure to remove the entire offending nail border. Once the nails were ensured to be removed area was debrided and the underlying skin was intact. There is no purulence identified in the procedure. Next phenol was then applied under standard conditions and copiously irrigated. Silvadene was applied. A dry sterile dressing was applied. After application of the dressing the tourniquet was removed and there is found to be an immediate capillary refill time to the digit. The patient tolerated the procedure well any complications. Post procedure instructions were discussed the patient for which he verbally understood. Follow-up in one week for nail check or sooner if any problems are to arise. Discussed signs/symptoms of infection and directed to call the office immediately should any occur or go directly to the emergency room. In the meantime, encouraged to call the office with any questions, concerns, changes symptoms. -Rx Keflex  Ovid CurdMatthew Lajuanna Pompa, DPM

## 2016-02-11 NOTE — Patient Instructions (Signed)

## 2016-02-18 ENCOUNTER — Ambulatory Visit: Payer: BLUE CROSS/BLUE SHIELD | Admitting: Podiatry

## 2017-06-12 ENCOUNTER — Ambulatory Visit: Payer: BLUE CROSS/BLUE SHIELD | Admitting: Family Medicine

## 2019-10-07 ENCOUNTER — Emergency Department: Payer: BC Managed Care – PPO

## 2019-10-07 ENCOUNTER — Inpatient Hospital Stay
Admission: EM | Admit: 2019-10-07 | Discharge: 2019-10-09 | DRG: 373 | Disposition: A | Discharge status: Home / Self Care | Payer: BC Managed Care – PPO | Attending: General Surgery | Admitting: General Surgery

## 2019-10-07 ENCOUNTER — Other Ambulatory Visit: Payer: Self-pay

## 2019-10-07 ENCOUNTER — Encounter: Payer: Self-pay | Admitting: Emergency Medicine

## 2019-10-07 DIAGNOSIS — K3532 Acute appendicitis with perforation and localized peritonitis, without abscess: Principal | ICD-10-CM | POA: Diagnosis present

## 2019-10-07 DIAGNOSIS — E669 Obesity, unspecified: Secondary | ICD-10-CM | POA: Diagnosis present

## 2019-10-07 DIAGNOSIS — R1011 Right upper quadrant pain: Secondary | ICD-10-CM | POA: Diagnosis not present

## 2019-10-07 DIAGNOSIS — Z833 Family history of diabetes mellitus: Secondary | ICD-10-CM

## 2019-10-07 DIAGNOSIS — Z20828 Contact with and (suspected) exposure to other viral communicable diseases: Secondary | ICD-10-CM | POA: Diagnosis present

## 2019-10-07 DIAGNOSIS — Z23 Encounter for immunization: Secondary | ICD-10-CM

## 2019-10-07 DIAGNOSIS — Z8249 Family history of ischemic heart disease and other diseases of the circulatory system: Secondary | ICD-10-CM

## 2019-10-07 DIAGNOSIS — Z6834 Body mass index (BMI) 34.0-34.9, adult: Secondary | ICD-10-CM

## 2019-10-07 DIAGNOSIS — F17210 Nicotine dependence, cigarettes, uncomplicated: Secondary | ICD-10-CM | POA: Diagnosis present

## 2019-10-07 LAB — CBC
HCT: 48 % (ref 39.0–52.0)
Hemoglobin: 16.8 g/dL (ref 13.0–17.0)
MCH: 29.6 pg (ref 26.0–34.0)
MCHC: 35 g/dL (ref 30.0–36.0)
MCV: 84.5 fL (ref 80.0–100.0)
Platelets: 241 10*3/uL (ref 150–400)
RBC: 5.68 MIL/uL (ref 4.22–5.81)
RDW: 11.9 % (ref 11.5–15.5)
WBC: 11.4 10*3/uL — ABNORMAL HIGH (ref 4.0–10.5)
nRBC: 0 % (ref 0.0–0.2)

## 2019-10-07 LAB — URINALYSIS, COMPLETE (UACMP) WITH MICROSCOPIC
Bacteria, UA: NONE SEEN
Bilirubin Urine: NEGATIVE
Glucose, UA: NEGATIVE mg/dL
Hgb urine dipstick: NEGATIVE
Ketones, ur: NEGATIVE mg/dL
Leukocytes,Ua: NEGATIVE
Nitrite: NEGATIVE
Protein, ur: NEGATIVE mg/dL
Specific Gravity, Urine: 1.019 (ref 1.005–1.030)
Squamous Epithelial / LPF: NONE SEEN (ref 0–5)
pH: 7 (ref 5.0–8.0)

## 2019-10-07 LAB — COMPREHENSIVE METABOLIC PANEL
ALT: 25 U/L (ref 0–44)
AST: 18 U/L (ref 15–41)
Albumin: 4.8 g/dL (ref 3.5–5.0)
Alkaline Phosphatase: 56 U/L (ref 38–126)
Anion gap: 10 (ref 5–15)
BUN: 11 mg/dL (ref 6–20)
CO2: 27 mmol/L (ref 22–32)
Calcium: 9.7 mg/dL (ref 8.9–10.3)
Chloride: 104 mmol/L (ref 98–111)
Creatinine, Ser: 0.98 mg/dL (ref 0.61–1.24)
GFR calc Af Amer: >60 mL/min (ref >60)
GFR calc non Af Amer: >60 mL/min (ref >60)
Glucose, Bld: 108 mg/dL — ABNORMAL HIGH (ref 70–99)
Potassium: 4.1 mmol/L (ref 3.5–5.1)
Sodium: 141 mmol/L (ref 135–145)
Total Bilirubin: 1 mg/dL (ref 0.3–1.2)
Total Protein: 7.8 g/dL (ref 6.5–8.1)

## 2019-10-07 LAB — LIPASE, BLOOD: Lipase: 22 U/L (ref 11–51)

## 2019-10-07 MED ORDER — IOHEXOL 9 MG/ML PO SOLN
500.0000 mL | Freq: Two times a day (BID) | ORAL | Status: DC | PRN
  Administered 2019-10-07: 500 mL via ORAL
  Filled 2019-10-07: qty 500

## 2019-10-07 MED ORDER — SODIUM CHLORIDE 0.9% FLUSH: 3.0000 mL | Freq: Once | INTRAVENOUS | Status: DC

## 2019-10-07 MED ORDER — ONDANSETRON 4 MG PO TBDP
8.0000 mg | ORAL_TABLET | Freq: Once | ORAL | Status: AC
Start: 2019-10-07 — End: 2019-10-07
  Administered 2019-10-07: 8 mg via ORAL
  Filled 2019-10-07: qty 2

## 2019-10-07 MED ORDER — OXYCODONE-ACETAMINOPHEN 5-325 MG PO TABS
1.0000 | ORAL_TABLET | Freq: Once | ORAL | Status: AC
  Administered 2019-10-07: 1 via ORAL
  Filled 2019-10-07: qty 1

## 2019-10-07 MED ORDER — IOHEXOL 300 MG/ML  SOLN
100.0000 mL | Freq: Once | INTRAMUSCULAR | Status: AC | PRN
  Administered 2019-10-07: 100 mL via INTRAVENOUS

## 2019-10-07 NOTE — ED Notes (Signed)
Pt and visitor given warm blanket.

## 2019-10-07 NOTE — ED Notes (Signed)
Pt otf for imaging 

## 2019-10-07 NOTE — ED Provider Notes (Signed)
-----------------------------------------   10:57 PM on 10/07/2019 -----------------------------------------  Assuming care from Dr. Cherylann Banas.  In short, Johnny Stein is a 23 y.o. male with a chief complaint of abdominal pain and nausea.  Refer to the original H&P for additional details.  The current plan of care is to follow-up CT scan and reassess.  Anticipate discharge if CT scan is unremarkable.   ----------------------------------------- 12:16 AM on 10/08/2019 -----------------------------------------  CT report indicates perforated appendicitis without abscess or significant fluid collection.  Updated and reassessed patient.  Pain well controlled but with localized peritonitis, no generalized peritonitis.  Hemodynamically stable.  I have updated him with my plan for antibiotics and to consult general surgery.  I have ordered and the nurses starting Zosyn 3.375 g IV.  He has already received 1 L normal saline.  I will wait until I speak with the general surgeon before ordering the COVID-19 test given the current restrictions on rapid testing but he will likely need either urgent surgery or urgent interventional radiology.  Patient and his mother who is at bedside understand and agree with the plan.  Surgery has been paged.   ----------------------------------------- 12:34 AM on 10/08/2019 -----------------------------------------  Discussed by phone with Dr. Celine Ahr with general surgery.  She will admit for medical management.  Agrees with antibiotics given.  Will put in admission orders.  As she is not planning for emergent surgery, the patient does not need rapid 2-hour COVID test, so I ordered the regular test.   Hinda Kehr, MD 10/08/19 0236

## 2019-10-07 NOTE — ED Provider Notes (Signed)
[facility] Emergency Department Provider Note ____________________________________________   First MD Initiated Contact with Patient 10/07/19 2050     (approximate)  I have reviewed the triage vital signs and the nursing notes.   HISTORY  Chief Complaint Abdominal Pain    HPI Johnny Stein is a 23 y.o. male with no significant PMH who presents with right upper quadrant pain for the last 2 days, initially more generalized in location.  It is associated with nausea but no vomiting.  He reports that he has had a few episodes of pain like this in the past that resolved on their own after a day or two.  He denies any fever.  History reviewed. No pertinent past medical history.  Patient Active Problem List   Diagnosis Date Noted  . Ingrown toenail 02/11/2016  . Abdominal pain 10/29/2015    Past Surgical History:  Procedure Laterality Date  . FEMUR SURGERY Bilateral     Prior to Admission medications   Medication Sig Start Date End Date Taking? Authorizing Provider  cephALEXin (KEFLEX) 500 MG capsule Take 1 capsule (500 mg total) by mouth 3 (three) times daily. Patient not taking: Reported on 10/07/2019 02/11/16   Vivi Barrack, DPM  ranitidine (ZANTAC) 150 MG tablet Take 150 mg by mouth 2 (two) times daily as needed for heartburn.    [provider]    Allergies Patient has no known allergies.  Family History  Problem Relation Age of Onset  . Hypertension Mother   . Diabetes Father     Social History Social History   Tobacco Use  . Smoking status: Light Tobacco Smoker    Types: Cigarettes  . Smokeless tobacco: Never Used  Substance Use Topics  . Alcohol use: Yes    Alcohol/week: 1.0 standard drinks    Types: 1 Shots of liquor per week  . Drug use: Not on file    Review of Systems  Constitutional: No fever. Eyes: No redness. ENT: No sore throat. Cardiovascular: Denies chest pain. Respiratory: Denies shortness of  breath. Gastrointestinal: Positive for nausea. Genitourinary: Negative for dysuria or hematuria.  Musculoskeletal: Negative for back pain. Skin: Negative for rash. Neurological: Negative for headache.   ____________________________________________   PHYSICAL EXAM:  VITAL SIGNS: ED Triage Vitals  Enc Vitals Group     BP 10/07/19 1837 (!) 145/83     Pulse Rate 10/07/19 1837 94     Resp 10/07/19 1837 16     Temp 10/07/19 1837 99.4 F (37.4 C)     Temp Source 10/07/19 1837 Oral     SpO2 10/07/19 1837 97 %     Weight 10/07/19 1835 245 lb (111.1 kg)     Height 10/07/19 1835 6' (1.829 m)     Head Circumference --      Peak Flow --      Pain Score 10/07/19 1834 8     Pain Loc --      Pain Edu? --      Excl. in GC? --     Constitutional: Alert and oriented.  Relatively well appearing and in no acute distress. Eyes: Conjunctivae are normal.  No scleral icterus. Head: Atraumatic. Nose: No congestion/rhinnorhea. Mouth/Throat: Mucous membranes are moist.   Neck: Normal range of motion.  Cardiovascular: Good peripheral circulation. Respiratory: Normal respiratory effort.  No retractions. Gastrointestinal: Soft with mild right upper and mid abdominal tenderness.  No peritoneal signs.  No distention.  Genitourinary: No flank tenderness. Musculoskeletal:  Extremities warm  and well perfused.  Neurologic:  Normal speech and language. No gross focal neurologic deficits are appreciated.  Skin:  Skin is warm and dry. No rash noted. Psychiatric: Mood and affect are normal. Speech and behavior are normal.  ____________________________________________   LABS (all labs ordered are listed, but only abnormal results are displayed)  Labs Reviewed  COMPREHENSIVE METABOLIC PANEL - Abnormal; Notable for the following components:      Result Value   Glucose, Bld 108 (*)    All other components within normal limits  CBC - Abnormal; Notable for the following components:   WBC 11.4 (*)    All  other components within normal limits  URINALYSIS, COMPLETE (UACMP) WITH MICROSCOPIC - Abnormal; Notable for the following components:   Color, Urine YELLOW (*)    APPearance CLOUDY (*)    All other components within normal limits  LIPASE, BLOOD   ____________________________________________  EKG   ____________________________________________  RADIOLOGY  US abdomen RUQ: No acute abnormality CT abdomen: Pending  ____________________________________________   PROCEDURES  Procedure(s) performed: No  Procedures  Critical Care performed: No ____________________________________________   INITIAL IMPRESSION / ASSESSMENT AND PLAN / ED COURSE  Pertinent labs & imaging results that were available during my care of the patient were reviewed by me and considered in my medical decision making (see chart for details).  23 year old male with PMH as noted above presents with 2 days of right upper quadrant abdominal pain with nausea but no vomiting.  He reports a few episodes in the past of similar pain that resolved on its own, but this is more severe and persistent.  On exam, he is well-appearing.  His vital signs are normal except for borderline elevated temperature.  He has mild tenderness in the right upper quadrant and right mid abdomen.  Differential includes biliary colic, acute cholecystitis, ureteral stone, or less likely acute appendicitis.  We will obtain a right upper quadrant ultrasound and labs.  If the ultrasound is unrevealing, I will proceed with CT.  ----------------------------------------- 11:07 PM on 10/07/2019 -----------------------------------------  Ultrasound shows no acute abnormalities.  The WBC count is slightly elevated.  I will proceed with CT to further evaluate.  I have signed the patient out to the oncoming physician Dr. Karma Greaser.  ____________________________________________   FINAL CLINICAL IMPRESSION(S) / ED DIAGNOSES  Final diagnoses:  Right  upper quadrant abdominal pain      NEW MEDICATIONS STARTED DURING THIS VISIT:  New Prescriptions   No medications on file     Note:  This document was prepared using Dragon voice recognition software and may include unintentional dictation errors.    Arta Silence, MD 10/07/19 2308

## 2019-10-07 NOTE — ED Triage Notes (Signed)
C/O 'weird stomach pain" x 3-4 days.  States has had similar in the past and was diagnosed with gastritis.  STates symptoms are different with this.  C/O RUQ pain.  Denies emesis, but c/o nausea.  AAOx3.  Skin warm and dry. NAD

## 2019-10-08 DIAGNOSIS — F17210 Nicotine dependence, cigarettes, uncomplicated: Secondary | ICD-10-CM | POA: Diagnosis present

## 2019-10-08 DIAGNOSIS — Z6834 Body mass index (BMI) 34.0-34.9, adult: Secondary | ICD-10-CM | POA: Diagnosis not present

## 2019-10-08 DIAGNOSIS — K3532 Acute appendicitis with perforation and localized peritonitis, without abscess: Secondary | ICD-10-CM | POA: Diagnosis present

## 2019-10-08 DIAGNOSIS — E669 Obesity, unspecified: Secondary | ICD-10-CM | POA: Diagnosis present

## 2019-10-08 DIAGNOSIS — Z833 Family history of diabetes mellitus: Secondary | ICD-10-CM | POA: Diagnosis not present

## 2019-10-08 DIAGNOSIS — Z8249 Family history of ischemic heart disease and other diseases of the circulatory system: Secondary | ICD-10-CM | POA: Diagnosis not present

## 2019-10-08 DIAGNOSIS — Z23 Encounter for immunization: Secondary | ICD-10-CM | POA: Diagnosis not present

## 2019-10-08 DIAGNOSIS — R1011 Right upper quadrant pain: Secondary | ICD-10-CM | POA: Diagnosis present

## 2019-10-08 DIAGNOSIS — Z20828 Contact with and (suspected) exposure to other viral communicable diseases: Secondary | ICD-10-CM | POA: Diagnosis present

## 2019-10-08 LAB — CBC
HCT: 44.8 % (ref 39.0–52.0)
Hemoglobin: 15.6 g/dL (ref 13.0–17.0)
MCH: 29.6 pg (ref 26.0–34.0)
MCHC: 34.8 g/dL (ref 30.0–36.0)
MCV: 85 fL (ref 80.0–100.0)
Platelets: 219 10*3/uL (ref 150–400)
RBC: 5.27 MIL/uL (ref 4.22–5.81)
RDW: 12.1 % (ref 11.5–15.5)
WBC: 9.5 10*3/uL (ref 4.0–10.5)
nRBC: 0 % (ref 0.0–0.2)

## 2019-10-08 LAB — HIV ANTIBODY (ROUTINE TESTING W REFLEX): HIV Screen 4th Generation wRfx: NONREACTIVE

## 2019-10-08 LAB — BASIC METABOLIC PANEL
Anion gap: 9 (ref 5–15)
BUN: 12 mg/dL (ref 6–20)
CO2: 27 mmol/L (ref 22–32)
Calcium: 8.8 mg/dL — ABNORMAL LOW (ref 8.9–10.3)
Chloride: 102 mmol/L (ref 98–111)
Creatinine, Ser: 1.01 mg/dL (ref 0.61–1.24)
GFR calc Af Amer: >60 mL/min (ref >60)
GFR calc non Af Amer: >60 mL/min (ref >60)
Glucose, Bld: 98 mg/dL (ref 70–99)
Potassium: 3.6 mmol/L (ref 3.5–5.1)
Sodium: 138 mmol/L (ref 135–145)

## 2019-10-08 LAB — SARS CORONAVIRUS 2 (TAT 6-24 HRS): SARS Coronavirus 2: NEGATIVE

## 2019-10-08 MED ORDER — PIPERACILLIN-TAZOBACTAM 3.375 G IVPB
3.3750 g | Freq: Three times a day (TID) | INTRAVENOUS | Status: DC
  Administered 2019-10-08 – 2019-10-09 (×4): 3.375 g via INTRAVENOUS
  Filled 2019-10-08 (×4): qty 50

## 2019-10-08 MED ORDER — KETOROLAC TROMETHAMINE 30 MG/ML IJ SOLN
30.0000 mg | Freq: Four times a day (QID) | INTRAMUSCULAR | Status: DC | PRN
  Administered 2019-10-08: 30 mg via INTRAVENOUS
  Filled 2019-10-08: qty 1

## 2019-10-08 MED ORDER — HYDROMORPHONE HCL 1 MG/ML IJ SOLN: 0.5000 mg | INTRAMUSCULAR | Status: DC | PRN

## 2019-10-08 MED ORDER — NICOTINE 7 MG/24HR TD PT24
7.0000 mg | MEDICATED_PATCH | Freq: Once | TRANSDERMAL | Status: AC
  Administered 2019-10-08: 7 mg via TRANSDERMAL
  Filled 2019-10-08: qty 1

## 2019-10-08 MED ORDER — SODIUM CHLORIDE 0.9 % IV SOLN
INTRAVENOUS | Status: DC | PRN
  Administered 2019-10-08: 20 mL via INTRAVENOUS
  Administered 2019-10-08 – 2019-10-09 (×3): 250 mL via INTRAVENOUS

## 2019-10-08 MED ORDER — OXYCODONE HCL 5 MG PO TABS: 5.0000 mg | ORAL_TABLET | ORAL | Status: DC | PRN

## 2019-10-08 MED ORDER — ONDANSETRON 4 MG PO TBDP: 4.0000 mg | ORAL_TABLET | Freq: Four times a day (QID) | ORAL | Status: DC | PRN

## 2019-10-08 MED ORDER — INFLUENZA VAC SPLIT QUAD 0.5 ML IM SUSY
0.5000 mL | PREFILLED_SYRINGE | INTRAMUSCULAR | Status: AC
  Administered 2019-10-09: 0.5 mL via INTRAMUSCULAR
  Filled 2019-10-08: qty 0.5

## 2019-10-08 MED ORDER — ONDANSETRON HCL 4 MG/2ML IJ SOLN: 4.0000 mg | Freq: Four times a day (QID) | INTRAMUSCULAR | Status: DC | PRN

## 2019-10-08 MED ORDER — PIPERACILLIN-TAZOBACTAM 3.375 G IVPB 30 MIN
3.3750 g | Freq: Once | INTRAVENOUS | Status: AC
  Administered 2019-10-08: 3.375 g via INTRAVENOUS
  Filled 2019-10-08: qty 50

## 2019-10-08 MED ORDER — ACETAMINOPHEN 500 MG PO TABS
1000.0000 mg | ORAL_TABLET | Freq: Four times a day (QID) | ORAL | Status: DC
  Administered 2019-10-08 – 2019-10-09 (×5): 1000 mg via ORAL
  Filled 2019-10-08 (×5): qty 2

## 2019-10-08 NOTE — H&P (Addendum)
[facility] SURGICAL HISTORY & PHYSICAL (cpt [id number])  HISTORY OF PRESENT ILLNESS (HPI):  23 y.o. male presented to [facility] ED on 11/23 for abdominal pain. Patient reports the onset of generalized abdominal pain about 48 - 72 hours ago which had since localized to the right side of his abdomen. He described it as a sharp and achy type pain. Nothing made this better. Exacerbated with movement. He endorses associated nausea with the pain. No fever, chills, cough, congestion, SOB, CP, or bladder/bowel changes. He does think he has had similar episodes of pain in the past but these have always spontaneously resolved. No previous abdominal surgeries. Work up in the ED was concerning for leukocytosis and perforated appendicitis with phlegmon but no abscess on CT.   General surgery is consulted by emergency medicine physician Dr Loleta Roseory Forbach, MD for evaluation and management of acute appendicitis.   Since admission, he reports that his pain is significantly better. Now just a mild soreness. No fever, chills, nausea, or emesis. Leukocytosis resolved. No other issues.    PAST MEDICAL HISTORY (PMH):  History reviewed. No pertinent past medical history.  Reviewed. Otherwise negative.   PAST SURGICAL HISTORY (PSH):  Past Surgical History:  Procedure Laterality Date  . FEMUR SURGERY Bilateral     Reviewed. Otherwise negative.   MEDICATIONS:  Prior to Admission medications   Not on File     ALLERGIES:  No Known Allergies   SOCIAL HISTORY:  Social History   Socioeconomic History  . Marital status: Single    Spouse name: Not on file  . Number of children: Not on file  . Years of education: Not on file  . Highest education level: Not on file  Occupational History  . Not on file  Social Needs  . Financial resource strain: Not on file  . Food insecurity    Worry: Not on file    Inability: Not on file  . Transportation needs    Medical: Not on file    Non-medical: Not on file   Tobacco Use  . Smoking status: Light Tobacco Smoker    Types: Cigarettes  . Smokeless tobacco: Never Used  Substance and Sexual Activity  . Alcohol use: Yes    Alcohol/week: 1.0 standard drinks    Types: 1 Shots of liquor per week  . Drug use: Not on file  . Sexual activity: Yes    Birth control/protection: Condom  Lifestyle  . Physical activity    Days per week: Not on file    Minutes per session: Not on file  . Stress: Not on file  Relationships  . Social Musicianconnections    Talks on phone: Not on file    Gets together: Not on file    Attends religious service: Not on file    Active member of club or organization: Not on file    Attends meetings of clubs or organizations: Not on file    Relationship status: Not on file  . Intimate partner violence    Fear of current or ex partner: Not on file    Emotionally abused: Not on file    Physically abused: Not on file    Forced sexual activity: Not on file  Other Topics Concern  . Not on file  Social History Narrative  . Not on file     FAMILY HISTORY:  Family History  Problem Relation Age of Onset  . Hypertension Mother   . Diabetes Father     Otherwise negative.  REVIEW OF SYSTEMS:  Review of Systems  Constitutional: Negative for chills and fever.  HENT: Negative for congestion and sore throat.   Respiratory: Negative for cough and shortness of breath.   Cardiovascular: Negative for chest pain and palpitations.  Gastrointestinal: Positive for abdominal pain and nausea. Negative for blood in stool, constipation, diarrhea and vomiting.  Genitourinary: Negative for dysuria and urgency.  All other systems reviewed and are negative.   VITAL SIGNS:  Temp:  [97.9 F (36.6 C)-99.4 F (37.4 C)] 98 F (36.7 C) (11/24 0527) Pulse Rate:  [67-94] 67 (11/24 0527) Resp:  [16-20] 16 (11/24 0527) BP: (104-152)/(59-91) 104/59 (11/24 0527) SpO2:  [97 %-100 %] 100 % (11/24 0527) Weight:  [111.1 kg-115.7 kg] 115.7 kg (11/24 0142)      Height: 6' (182.9 cm) Weight: 115.7 kg BMI (Calculated): 34.59   PHYSICAL EXAM:  Physical Exam Constitutional:      General: He is not in acute distress.    Appearance: He is well-developed. He is obese. He is not ill-appearing.  HENT:     Head: Normocephalic and atraumatic.  Eyes:     General: No scleral icterus.    Extraocular Movements: Extraocular movements intact.  Cardiovascular:     Rate and Rhythm: Normal rate and regular rhythm.     Heart sounds: Normal heart sounds. No murmur. No friction rub. No gallop.   Pulmonary:     Effort: Pulmonary effort is normal. No respiratory distress.     Breath sounds: Normal breath sounds. No wheezing or rhonchi.  Abdominal:     General: Abdomen is flat. There is no distension.     Palpations: Abdomen is soft.     Tenderness: There is no abdominal tenderness. There is no guarding or rebound. Negative signs include Rovsing's sign and McBurney's sign.  Genitourinary:    Comments: deferred Skin:    General: Skin is warm and dry.     Coloration: Skin is not jaundiced or pale.  Neurological:     General: No focal deficit present.     Mental Status: He is alert and oriented to person, place, and time.  Psychiatric:        Mood and Affect: Mood normal.        Behavior: Behavior normal.     INTAKE/OUTPUT:  This shift: No intake/output data recorded.  Last 2 shifts: @IOLAST2SHIFTS @  Labs:  CBC Latest Ref Rng & Units 10/07/2019 11/28/2015 10/30/2015  WBC 4.0 - 10.5 K/uL 11.4(H) 8.6 9.1  Hemoglobin 13.0 - 17.0 g/dL 11/01/2015 67.3 41.9  Hematocrit 39.0 - 52.0 % 48.0 48.5 45.8  Platelets 150 - 400 K/uL 241 226 215   CMP Latest Ref Rng & Units 10/07/2019 11/28/2015 10/30/2015  Glucose 70 - 99 mg/dL 11/01/2015) 96 99  BUN 6 - 20 mg/dL 11 11 11   Creatinine 0.61 - 1.24 mg/dL 024(O 9.73  Sodium 135 - 145 mmol/L 141 138 141  Potassium 3.5 - 5.1 mmol/L 4.1 3.8 3.2(L)  Chloride 98 - 111 mmol/L 104 105 106  CO2 22 - 32 mmol/L 27 28 32  Calcium 8.9  - 10.3 mg/dL 9.7 9.0 9.0  Total Protein 6.5 - 8.1 g/dL 7.8 7.7 -  Total Bilirubin 0.3 - 1.2 mg/dL 1.0 0.7 -  Alkaline Phos 38 - 126 U/L 56 60 -  AST 15 - 41 U/L 18 18 -  ALT 0 - 44 U/L 25 24 -    Imaging studies:   CT Abdomen/Pelvis (10/07/2019) personally reviewed showing inflammatory  response in the RLQ concerning for appendicitis, no free air, no appreciable abscess, and radiologist report reviewed below:  IMPRESSION: 1. Perforated appendicitis with small volume of adjacent free fluid and punctate foci of mesenteric gas. No organized abscess or collection. 2. Reactive adenopathy throughout the mesentery most pronounced in the right lower quadrant.   Assessment/Plan: (ICD-10's: K85.32) 23 y.o. male with acute appendicitis with phlegmon but without abscess   - Admit to general surgery  - NPO until pain improves  - IV Abx (Zosyn >> Day 2)  - Monitor leukocytosis; fever curve  - Monitor abdominal examination  - May consider re-imaging pending clinical condition in 24-48 hours to ensure no development of abscess   - No indication for emergent intervention; patient will benefit from interval appendectomy. He understands if he clinically deteriorates then he would require more urgent intervention   - medical management of comorbidities  - mobilize   - DVT prophylaxis  All of the above findings and recommendations were discussed with the patient, and all of his questions were answered to his expressed satisfaction.  -- Edison Simon, PA-C [facility] 10/08/2019, 7:10 AM [phone] M-F: 7am - 4pm  I saw and evaluated the patient.  I agree with the above documentation, exam, and plan, which I have edited where appropriate. Fredirick Maudlin  12:42 PM

## 2019-10-08 NOTE — ED Notes (Signed)
covid swab walked to lab

## 2019-10-08 NOTE — Progress Notes (Signed)
23 y/o M presenting w/2 days of right-sided abdominal pain.  [place] negative for biliary pathology.  CT consistent with perforated appendicitis without abscess, but with phlegmon.  Will admit and tx with abx.  May require reimaging if fails to improve, to see if drainable abscess evolves.  Will ultimately require appendectomy, but hopefully can defer and perform as interval operation due to significant inflammation present on CT.  Full H&P to follow.

## 2019-10-08 NOTE — ED Notes (Signed)
Report given to floor RN

## 2019-10-09 LAB — CBC
HCT: 44.3 % (ref 39.0–52.0)
Hemoglobin: 15.4 g/dL (ref 13.0–17.0)
MCH: 29.4 pg (ref 26.0–34.0)
MCHC: 34.8 g/dL (ref 30.0–36.0)
MCV: 84.5 fL (ref 80.0–100.0)
Platelets: 208 10*3/uL (ref 150–400)
RBC: 5.24 MIL/uL (ref 4.22–5.81)
RDW: 11.8 % (ref 11.5–15.5)
WBC: 8.1 10*3/uL (ref 4.0–10.5)
nRBC: 0 % (ref 0.0–0.2)

## 2019-10-09 MED ORDER — AMOXICILLIN-POT CLAVULANATE 875-125 MG PO TABS: 1.0000 | ORAL_TABLET | Freq: Two times a day (BID) | ORAL | 0 refills | Status: AC

## 2019-10-09 NOTE — Discharge Summary (Signed)
[facility] SURGICAL DISCHARGE SUMMARY (cpt: [phone])  Patient ID: Johnny Stein MRN: [id number] DOB/AGE: 1996-06-15 23 y.o.  Admit date: 10/07/2019 Discharge date: 10/09/2019  Discharge Diagnoses Patient Active Problem List   Diagnosis Date Noted  . Acute perforated appendicitis 10/08/2019    Consultants None  Procedures None  HPI: 22 y.o. male presented to [facility] ED on 11/23 for abdominal pain. Patient reports the onset of generalized abdominal pain about 48 - 72 hours ago which had since localized to the right side of his abdomen. He described it as a sharp and achy type pain. Nothing made this better. Exacerbated with movement. He endorses associated nausea with the pain. No fever, chills, cough, congestion, SOB, CP, or bladder/bowel changes. He does think he has had similar episodes of pain in the past but these have always spontaneously resolved. No previous abdominal surgeries. Work up in the ED was concerning for leukocytosis and perforated appendicitis with phlegmon but no abscess on CT.   Hospital Course: Admitted to general surgery with plans for conservative management. He tolerated this well and on hospital day 1 his pain was improved and his leukocytosis was resolved. Advancement of patient's diet and ambulation were well-tolerated. The remainder of patient's hospital course was essentially unremarkable, and discharge planning was initiated accordingly with patient safely able to be discharged home with appropriate discharge instructions, antibiotics (augmentin x12 days), pain control, and outpatient follow-up after all of his questions were answered to his expressed satisfaction.   Discharge Condition: Good   Physical Examination:  Constitutional: Well appearing male, AND HEENT: EOMI, PERRLA Pulmonary: Normal effort, no respiratory distress Cardiac: HRRR, no m/r/g Gastrointestinal: Soft, non-tender, non-distended, no rebound/guarding Skin: warm,  dry   Allergies as of 10/09/2019   No Known Allergies     Medication List    TAKE these medications   amoxicillin-clavulanate 875-125 MG tablet Commonly known as: Augmentin Take 1 tablet by mouth 2 (two) times daily for 12 days.        Follow-up Information    Fredirick Maudlin, MD. Schedule an appointment as soon as possible for a visit in 3 week(s).   Specialty: General Surgery Why: 2-3 week follow up with Dr Celine Ahr, perofrated appendicitis, discuss interval appy Contact information: [place] [address] [phone]            Time spent on discharge management including discussion of hospital course, clinical condition, outpatient instructions, prescriptions, and follow up with the patient and members of the medical team: >30 minutes  -- Edison Simon , PA-C [facility]  10/09/2019, 10:05 AM [phone] M-F: 7am - 4pm

## 2019-10-09 NOTE — Progress Notes (Signed)
Johnny Guan, RN 10/09/2019 11:23 AM   [name] to be D/C'd Home per MD order.  Discussed prescriptions and follow up appointments with the patient. Prescriptions given to patient, medication list explained in detail. Pt verbalized understanding.  Allergies as of 10/09/2019   No Known Allergies     Medication List    TAKE these medications   amoxicillin-clavulanate 875-125 MG tablet Commonly known as: Augmentin Take 1 tablet by mouth 2 (two) times daily for 12 days. Notes to patient: 10/09/19       Vitals:   10/08/19 2153 10/09/19 0432  BP: 135/74 114/67  Pulse: 68 67  Resp: 20 16  Temp: 97.8 F (36.6 C) 97.8 F (36.6 C)  SpO2: 99% 98%    Skin clean, dry and intact without evidence of skin break down, no evidence of skin tears noted. IV catheter discontinued intact. Site without signs and symptoms of complications. Dressing and pressure applied. Pt denies pain at this time. No complaints noted.  An After Visit Summary was printed and given to the patient. Patient escorted via [place], and D/C home via private auto.  Johnny Stein

## 2019-10-24 ENCOUNTER — Ambulatory Visit (INDEPENDENT_AMBULATORY_CARE_PROVIDER_SITE_OTHER): Payer: BC Managed Care – PPO | Admitting: General Surgery

## 2019-10-24 ENCOUNTER — Other Ambulatory Visit: Payer: Self-pay

## 2019-10-24 ENCOUNTER — Encounter: Payer: Self-pay | Admitting: General Surgery

## 2019-10-24 VITALS — BP 153/99 | HR 73 | Temp 97.5°F | Resp 14 | Ht 72.0 in | Wt 260.4 lb

## 2019-10-24 DIAGNOSIS — K3532 Acute appendicitis with perforation and localized peritonitis, without abscess: Secondary | ICD-10-CM

## 2019-10-24 NOTE — Patient Instructions (Addendum)
Please call our office if you have questions or concerns.  You may start taking Probiotics.  This can be purchased over the counter.   Laparoscopic Appendectomy, Adult, Care After This sheet gives you information about how to care for yourself after your procedure. Your doctor may also give you more specific instructions. If you have problems or questions, contact your doctor. What can I expect after the procedure? After the procedure, it is common to have:  Little energy for normal activities.  Mild pain in the area where the cuts from surgery (incisions) were made.  Trouble pooping (constipation). This can be caused by: ? Pain medicine. ? A lack of activity. Follow these instructions at home: Medicines  Take over-the-counter and prescription medicines only as told by your doctor.  If you were prescribed an antibiotic medicine, take it as told by your doctor. Do not stop taking it even if you start to feel better.  Do not drive or use heavy machinery while taking prescription pain medicine.  Ask your doctor if the medicine you are taking can cause trouble pooping. You may need to take steps to prevent or treat trouble pooping: ? Drink enough fluid to keep your pee (urine) pale yellow. ? Take over-the-counter or prescription medicines. ? Eat foods that are high in fiber. These include beans, whole grains, and fresh fruits and vegetables. ? Limit foods that are high in fat and sugar. These include fried or sweet foods. Incision care   Follow instructions from your doctor about how to take care of your cuts from surgery. Make sure you: ? Wash your hands with soap and water before and after you change your bandage (dressing). If you cannot use soap and water, use hand sanitizer. ? Change your bandage as told by your doctor. ? Leave stitches (sutures), skin glue, or skin tape (adhesive) strips in place. They may need to stay in place for 2 weeks or longer. If tape strips get loose and  curl up, you may trim the loose edges. Do not remove tape strips completely unless your doctor says it is okay.  Check your cuts from surgery every day for signs of infection. Check for: ? Redness, swelling, or pain. ? Fluid or blood. ? Warmth. ? Pus or a bad smell. Bathing  Keep your cuts from surgery clean and dry. Clean them as told by your doctor. To do this: 1. Gently wash the cuts with soap and water. 2. Rinse the cuts with water to remove all soap. 3. Pat the cuts dry with a clean towel. Do not rub the cuts.  Do not take baths, swim, or use a hot tub for 2 weeks, or until your doctor says it is okay. You may take showers after 48 hours. Activity   Do not drive for 24 hours if you were given a medicine to help you relax (sedative) during your procedure.  Rest after the procedure. Return to your normal activities as told by your doctor. Ask your doctor what activities are safe for you.  For 3 weeks, or for as long as told by your doctor: ? Do not lift anything that is heavier than 10 lb (4.5 kg), or the limit that you are told. ? Do not play contact sports. General instructions  If you were sent home with a drain, follow instructions from your doctor on how to care for it.  Take deep breaths. This helps to keep your lungs from getting an infection (pneumonia).  Keep all follow-up  visits as told by your doctor. This is important. Contact a doctor if:  You have redness, swelling, or pain around a cut from surgery.  You have fluid or blood coming from a cut.  Your cut feels warm to the touch.  You have pus or a bad smell coming from a cut or a bandage.  The edges of a cut break open after the stitches have been taken out.  You have pain in your shoulders that gets worse.  You feel dizzy or you pass out (faint).  You have shortness of breath.  You keep feeling sick to your stomach (nauseous).  You keep throwing up (vomiting).  You get watery poop (diarrhea) or  you cannot control your poop.  You lose your appetite.  You have swelling or pain in your legs.  You get a rash. Get help right away if:  You have a fever.  You have trouble breathing.  You have sharp pains in your chest. Summary  After the procedure, it is common to have low energy, mild pain, and trouble pooping.  Infection is a common problem after this procedure. Follow your doctor's instructions about caring for yourself after the procedure.  Rest after the procedure. Return to your normal activities as told by your doctor.  Contact your doctor if you see signs of infection around your cuts from surgery, or you get short of breath. Get help right away if you have a fever, chest pain, or trouble breathing. This information is not intended to replace advice given to you by your health care provider. Make sure you discuss any questions you have with your health care provider. Document Released: 08/27/2009 Document Revised: 05/03/2018 Document Reviewed: 05/03/2018 Elsevier Patient Education  2020 [employer] Appendicitis, Adult  The appendix is a tube in the body that is shaped like a finger. It is attached to the large intestine. Appendicitis means that this tube is swollen (inflamed). If this is not treated, the tube can tear (rupture). This can lead to a life-threatening infection. This condition can also cause pus to build up in the appendix (abscess). What are the causes? This condition may be caused by something that blocks the appendix. These include:  A ball of poop (stool).  Lymph glands that are bigger than normal. Sometimes the cause is not known. What increases the risk? You are more likely to develop this condition if you are between 5710 and 24 years of age. What are the signs or symptoms? Symptoms of this condition include:  Pain around the belly button (navel). ? The pain moves toward the lower right belly (abdomen). ? The pain can get worse with time. ?  The pain can get worse if you cough. ? The pain can get worse if you move suddenly.  Tenderness in the lower right belly.  Feeling sick to your stomach (nauseous).  Throwing up (vomiting).  Not feeling hungry (loss of appetite).  A fever.  Having trouble pooping (constipation).  Watery poop (diarrhea).  Not feeling well. How is this treated? Most often, this condition is treated by taking out the appendix (appendectomy). There are two ways to do this:  Open surgery. For this method, the appendix is taken out through a large cut (incision). The cut is made in the lower right belly. This surgery may be used if: ? You have scars from another surgery. ? You have a bleeding condition. ? You are pregnant and will be having your baby soon. ? You have a  condition that makes it hard to do the other type of surgery.  Laparoscopic surgery. For this method, the appendix is taken out through small cuts. Often, this surgery: ? Causes less pain. ? Causes fewer problems. ? Is easier to heal from. If your appendix tears and pus forms:  A drain may be put into the sore. The drain will be used to get rid of the pus.  You may get an antibiotic medicine through an IV line.  Your appendix may or may not need to be taken out. Follow these instructions at home: If you had surgery, follow instructions from your doctor on how to care for yourself at home and how to take care of your cut from surgery. Medicines  Take over-the-counter and prescription medicines only as told by your doctor.  If you were prescribed an antibiotic medicine, take it as told by your doctor. Do not stop taking the antibiotic even if you start to feel better. Eating and drinking Follow instructions from your doctor about what you cannot eat or drink. You may go back to your diet slowly if:  You no longer feel sick to your stomach.  You have stopped throwing up. General instructions  Do not use any products that  contain nicotine or tobacco, such as cigarettes, e-cigarettes, and chewing tobacco. If you need help quitting, ask your doctor.  Do not drive or use heavy machinery while taking prescription pain medicine.  Ask your doctor if the medicine you are taking can cause trouble pooping. You may need to take steps to prevent or treat trouble pooping: ? Drink enough fluid to keep your pee (urine) pale yellow. ? Take over-the-counter or prescription medicines. ? Eat foods that are high in fiber. These include beans, whole grains, and fresh fruits and vegetables. ? Limit foods that are high in fat and sugar. These include fried or sweet foods.  Keep all follow-up visits as told by your doctor. This is important. Contact a doctor if:  There is pus, blood, or a lot of fluid coming from your cut or cuts from surgery.  You are sick to your stomach or you throw up. Get help right away if:  You have pain in your belly, and the pain is getting worse.  You have a fever.  You have chills.  You are very tired.  You have muscle pain.  You are short of breath. Summary  Appendicitis is swelling of the appendix. The appendix is a tube that is shaped like a finger. It is joined to the large intestine.  This condition may be caused by something that blocks the appendix. This can lead to an infection.  This condition is usually treated by taking out the appendix. This information is not intended to replace advice given to you by your health care provider. Make sure you discuss any questions you have with your health care provider. Document Released: 01/23/2012 Document Revised: 04/18/2018 Document Reviewed: 04/18/2018 Elsevier Patient Education  2020 [employer].

## 2019-10-24 NOTE — Progress Notes (Signed)
Johnny Stein is here today for follow-up after being hospitalized with perforated appendicitis from November 23 through October 09, 2019..  Due to phlegmon in the area, without abscess, he was treated with antibiotics, in anticipation of an interval appendectomy.  He has 2 more days left on his antibiotic regimen.  He denies any fevers or chills.  No nausea or vomiting.  No abdominal pain.  He has had some looser stools, but he also has irritable bowel syndrome and cannot distinguish whether this is related to the antibiotics or just his usual irregular bowel function.  History reviewed. No pertinent past medical history. Past Surgical History:  Procedure Laterality Date  . FEMUR SURGERY Bilateral    Family History  Problem Relation Age of Onset  . Hypertension Mother   . Diabetes Father    Social History   Tobacco Use  . Smoking status: Light Tobacco Smoker    Types: Cigarettes  . Smokeless tobacco: Never Used  Substance Use Topics  . Alcohol use: Yes    Alcohol/week: 1.0 standard drinks    Types: 1 Shots of liquor per week  . Drug use: Not on file   Current Meds  Medication Sig  . amoxicillin-clavulanate (AUGMENTIN) 875-125 MG tablet Take 1 tablet by mouth 2 (two) times daily.  . [DISCONTINUED] amoxicillin (AMOXIL) 875 MG tablet Take 875 mg by mouth 2 (two) times daily.   No Known Allergies  Today's Vitals   10/24/19 0941  BP: (!) 153/99  Pulse: 73  Resp: 14  Temp: (!) 97.5 F (36.4 C)  TempSrc: Temporal  SpO2: 98%  Weight: 260 lb 6.4 oz (118.1 kg)  Height: 6' (1.829 m)  PainSc: 0-No pain   Body mass index is 35.32 kg/m. Physical Exam  Constitutional: He is oriented to person, place, and time. He appears well-developed and well-nourished.  HENT:  Head: Normocephalic and atraumatic.  Eyes: Right eye exhibits no discharge. Left eye exhibits no discharge. No scleral icterus.  Neck: No tracheal deviation present.  Cardiovascular: Normal rate and regular rhythm.   Pulmonary/Chest: Effort normal. No stridor. No respiratory distress.  Abdominal: Soft. Bowel sounds are normal. He exhibits no distension. There is no abdominal tenderness. There is no rebound and no guarding.  Genitourinary:    Genitourinary Comments: Deferred   Musculoskeletal:        General: No tenderness or edema.  Neurological: He is alert and oriented to person, place, and time.  Skin: Skin is warm and dry.   Labs: Results for RACER, QUAM (MRN [id number]) as of 10/24/2019 12:11  Ref. Range 10/08/2019 04:02 10/09/2019 02:58  BASIC METABOLIC PANEL Unknown Rpt (A)   Sodium Latest Ref Range: 135 - 145 mmol/L 138   Potassium Latest Ref Range: 3.5 - 5.1 mmol/L 3.6   Chloride Latest Ref Range: 98 - 111 mmol/L 102   CO2 Latest Ref Range: 22 - 32 mmol/L 27   Glucose Latest Ref Range: 70 - 99 mg/dL 98   BUN Latest Ref Range: 6 - 20 mg/dL 12   Creatinine Latest Ref Range: 0.61 - 1.24 mg/dL 1.01   Calcium Latest Ref Range: 8.9 - 10.3 mg/dL 8.8 (L)   Anion gap Latest Ref Range: 5 - 15  9   GFR, Est Non African American Latest Ref Range: >60 mL/min >60   GFR, Est African American Latest Ref Range: >60 mL/min >60   WBC Latest Ref Range: 4.0 - 10.5 K/uL 9.5 8.1  RBC Latest Ref Range: 4.22 - 5.81 MIL/uL  5.27 5.24  Hemoglobin Latest Ref Range: 13.0 - 17.0 g/dL 54.6 50.3  HCT Latest Ref Range: 39.0 - 52.0 % 44.8 44.3  MCV Latest Ref Range: 80.0 - 100.0 fL 85.0 84.5  MCH Latest Ref Range: 26.0 - 34.0 pg 29.6 29.4  MCHC Latest Ref Range: 30.0 - 36.0 g/dL 54.6 56.8  RDW Latest Ref Range: 11.5 - 15.5 % 12.1 11.8  Platelets Latest Ref Range: 150 - 400 K/uL 219 208  nRBC Latest Ref Range: 0.0 - 0.2 % 0.0 0.0   Imaging: CLINICAL DATA:  Two days of right upper quadrant pain  EXAM: CT ABDOMEN AND PELVIS WITH CONTRAST  TECHNIQUE: Multidetector CT imaging of the abdomen and pelvis was performed using the standard protocol following bolus administration of intravenous  contrast.  CONTRAST:  OMNIPAQUE IOHEXOL 300 MG/ML  SOLN  COMPARISON:  CT renal colic 10/29/2015  FINDINGS: Lower chest: Lung bases are clear. Normal heart size. No pericardial effusion.  Hepatobiliary: No focal liver abnormality is seen. No gallstones, gallbladder wall thickening, or biliary dilatation.  Pancreas: Unremarkable. No pancreatic ductal dilatation or surrounding inflammatory changes.  Spleen: Normal in size without focal abnormality.  Adrenals/Urinary Tract: Adrenal glands are unremarkable. Subcentimeter hypoattenuating focus in the upper pole right kidney too small to fully characterize on CT imaging but statistically likely benign. Kidneys are otherwise unremarkable, without renal calculi, suspicious lesion, or hydronephrosis. Bladder is unremarkable.  Stomach/Bowel: Distal esophagus, stomach and duodenal sweep are unremarkable. No small bowel wall thickening or dilatation. No evidence of obstruction. Thick-walled and hyperemic appendix seen in the right lower quadrant with adjacent phlegmon with a small volume of adjacent free fluid and additional trace free fluid layering in the deep pelvis. Few punctate foci of free intraperitoneal gas (2/68) compatible with perforation. In appendicitis was also present on comparison images from 2016.  Vascular/Lymphatic: The aorta is normal caliber. Reactive adenopathy with throughout the mesentery most pronounced in the right lower quadrant.  Reproductive: The prostate and seminal vesicles are unremarkable.  Other: Free fluid and punctate foci of mesenteric gas, as above. No bowel containing hernias.  Musculoskeletal: No acute osseous abnormality or suspicious osseous lesion. Small amount of vacuum phenomenon of the right SI joint.  IMPRESSION: 1. Perforated appendicitis with small volume of adjacent free fluid and punctate foci of mesenteric gas. No organized abscess or collection. 2. Reactive  adenopathy throughout the mesentery most pronounced in the right lower quadrant.  Impression/Plan: This is a 23 year old man with acute perforated appendicitis, treated conservatively secondary to phlegmon without abscess.  He will complete his course of antibiotics.  We will schedule him for an interval appendectomy near the middle to end of January.  I discussed the risks of the operation with him today.  These include, but are not limited to, bleeding, infection, damage to surrounding tissues or structures, need to convert to an open operation, and need for other operations or interventions.  He will contact us in the interim, if any new problems, questions, or concerns arise.  Otherwise, we will look forward to performing his outpatient operation in January.  He understands that there will be a preoperative anesthesia visit.  I will update his history and physical on the day of surgery.

## 2019-10-30 ENCOUNTER — Telehealth: Payer: Self-pay | Admitting: General Surgery

## 2019-10-30 ENCOUNTER — Other Ambulatory Visit: Payer: Self-pay | Admitting: General Surgery

## 2019-10-30 DIAGNOSIS — K3532 Acute appendicitis with perforation and localized peritonitis, without abscess: Secondary | ICD-10-CM

## 2019-10-30 NOTE — Telephone Encounter (Signed)
Pt has been advised of pre admission date/time, Covid Testing date and Surgery date.  Surgery Date: 12/02/19-with Dr Wilburn Cornelia appendectomy Preadmission Testing Date: 11/27/19 between 8-1:00pm-phone interview.  Covid Testing Date: 11/28/19 between 8-10:30am. - patient advised to go to the [facility] ([place])  [employer] Video sent via [employer] [facility] and [employer].  Patient has been made aware to call [phone], between 1-3:00pm the Friday before surgery, to find out what time to arrive.

## 2019-11-12 ENCOUNTER — Telehealth: Payer: Self-pay | Admitting: *Deleted

## 2019-11-12 ENCOUNTER — Other Ambulatory Visit: Payer: Self-pay

## 2019-11-12 ENCOUNTER — Encounter: Payer: Self-pay | Admitting: *Deleted

## 2019-11-12 DIAGNOSIS — F17210 Nicotine dependence, cigarettes, uncomplicated: Secondary | ICD-10-CM | POA: Diagnosis present

## 2019-11-12 DIAGNOSIS — K3580 Unspecified acute appendicitis: Principal | ICD-10-CM | POA: Diagnosis present

## 2019-11-12 DIAGNOSIS — Z8249 Family history of ischemic heart disease and other diseases of the circulatory system: Secondary | ICD-10-CM

## 2019-11-12 DIAGNOSIS — Z20828 Contact with and (suspected) exposure to other viral communicable diseases: Secondary | ICD-10-CM | POA: Diagnosis present

## 2019-11-12 DIAGNOSIS — Z833 Family history of diabetes mellitus: Secondary | ICD-10-CM

## 2019-11-12 DIAGNOSIS — R1031 Right lower quadrant pain: Secondary | ICD-10-CM | POA: Diagnosis not present

## 2019-11-12 LAB — CBC
HCT: 46.9 % (ref 39.0–52.0)
Hemoglobin: 15.9 g/dL (ref 13.0–17.0)
MCH: 29.5 pg (ref 26.0–34.0)
MCHC: 33.9 g/dL (ref 30.0–36.0)
MCV: 87 fL (ref 80.0–100.0)
Platelets: 220 10*3/uL (ref 150–400)
RBC: 5.39 MIL/uL (ref 4.22–5.81)
RDW: 12.1 % (ref 11.5–15.5)
WBC: 7.3 10*3/uL (ref 4.0–10.5)
nRBC: 0 % (ref 0.0–0.2)

## 2019-11-12 LAB — URINALYSIS, COMPLETE (UACMP) WITH MICROSCOPIC
Bacteria, UA: NONE SEEN
Bilirubin Urine: NEGATIVE
Glucose, UA: NEGATIVE mg/dL
Hgb urine dipstick: NEGATIVE
Ketones, ur: NEGATIVE mg/dL
Leukocytes,Ua: NEGATIVE
Nitrite: NEGATIVE
Protein, ur: NEGATIVE mg/dL
Specific Gravity, Urine: 1.028 (ref 1.005–1.030)
Squamous Epithelial / HPF: NONE SEEN (ref 0–5)
pH: 7 (ref 5.0–8.0)

## 2019-11-12 LAB — COMPREHENSIVE METABOLIC PANEL
ALT: 36 U/L (ref 0–44)
AST: 23 U/L (ref 15–41)
Albumin: 4.4 g/dL (ref 3.5–5.0)
Alkaline Phosphatase: 67 U/L (ref 38–126)
Anion gap: 8 (ref 5–15)
BUN: 13 mg/dL (ref 6–20)
CO2: 27 mmol/L (ref 22–32)
Calcium: 9 mg/dL (ref 8.9–10.3)
Chloride: 105 mmol/L (ref 98–111)
Creatinine, Ser: 0.92 mg/dL (ref 0.61–1.24)
GFR calc Af Amer: >60 mL/min (ref >60)
GFR calc non Af Amer: >60 mL/min (ref >60)
Glucose, Bld: 124 mg/dL — ABNORMAL HIGH (ref 70–99)
Potassium: 3.8 mmol/L (ref 3.5–5.1)
Sodium: 140 mmol/L (ref 135–145)
Total Bilirubin: 0.6 mg/dL (ref 0.3–1.2)
Total Protein: 7.5 g/dL (ref 6.5–8.1)

## 2019-11-12 LAB — LIPASE, BLOOD: Lipase: 26 U/L (ref 11–51)

## 2019-11-12 MED ORDER — IOHEXOL 9 MG/ML PO SOLN
500.0000 mL | ORAL | Status: AC
  Administered 2019-11-12: 500 mL via ORAL

## 2019-11-12 MED ORDER — SODIUM CHLORIDE 0.9% FLUSH: 3.0000 mL | Freq: Once | INTRAVENOUS | Status: DC

## 2019-11-12 NOTE — ED Triage Notes (Signed)
Pt scheduled for appendectomy on january 14. On Sunday, onset of nausea and vomiting (x1). Since then he has had diarrhea, felt unwell and "dull" right sided abdominal pain. Denies fevers.

## 2019-11-12 NOTE — Telephone Encounter (Signed)
Patient called and is scheduled for surgery on 12/02/19 with Dr.Cannon for appendectomy. Friday evening patient was nauseas and vomited once but since then he stated that he just does not feel "right" he stated that he is nauseous all the time now and feels clammy, he has some right sided pain. He also finished his antibiotics about a week ago. Please call and advise

## 2019-11-12 NOTE — Telephone Encounter (Signed)
Per Dr.Cannon advised me to inform patient he will need to go to the ED to have another CT scan. Spoke with patient to notify him of Dr.Cannon's recommendations. Patient verbalized understanding.

## 2019-11-13 ENCOUNTER — Encounter: Payer: Self-pay | Admitting: Radiology

## 2019-11-13 ENCOUNTER — Emergency Department: Payer: BC Managed Care – PPO

## 2019-11-13 ENCOUNTER — Inpatient Hospital Stay
Admission: EM | Admit: 2019-11-13 | Discharge: 2019-11-14 | DRG: 395 | Disposition: A | Discharge status: Home / Self Care | Payer: BC Managed Care – PPO | Attending: General Surgery | Admitting: General Surgery

## 2019-11-13 DIAGNOSIS — K3580 Unspecified acute appendicitis: Secondary | ICD-10-CM | POA: Diagnosis present

## 2019-11-13 DIAGNOSIS — Z20828 Contact with and (suspected) exposure to other viral communicable diseases: Secondary | ICD-10-CM | POA: Diagnosis present

## 2019-11-13 DIAGNOSIS — K353 Acute appendicitis with localized peritonitis, without perforation or gangrene: Secondary | ICD-10-CM

## 2019-11-13 DIAGNOSIS — Z8249 Family history of ischemic heart disease and other diseases of the circulatory system: Secondary | ICD-10-CM | POA: Diagnosis not present

## 2019-11-13 DIAGNOSIS — F17210 Nicotine dependence, cigarettes, uncomplicated: Secondary | ICD-10-CM | POA: Diagnosis present

## 2019-11-13 DIAGNOSIS — Z833 Family history of diabetes mellitus: Secondary | ICD-10-CM | POA: Diagnosis not present

## 2019-11-13 DIAGNOSIS — R1031 Right lower quadrant pain: Secondary | ICD-10-CM | POA: Diagnosis present

## 2019-11-13 LAB — RESPIRATORY PANEL BY RT PCR (FLU A&B, COVID)
Influenza A by PCR: NEGATIVE
Influenza B by PCR: NEGATIVE
SARS Coronavirus 2 by RT PCR: NEGATIVE

## 2019-11-13 MED ORDER — IOHEXOL 300 MG/ML  SOLN
100.0000 mL | Freq: Once | INTRAMUSCULAR | Status: AC | PRN
  Administered 2019-11-13: 100 mL via INTRAVENOUS

## 2019-11-13 MED ORDER — MORPHINE SULFATE (PF) 4 MG/ML IV SOLN
4.0000 mg | Freq: Once | INTRAVENOUS | Status: AC
  Administered 2019-11-13: 4 mg via INTRAVENOUS
  Filled 2019-11-13: qty 1

## 2019-11-13 MED ORDER — ONDANSETRON HCL 4 MG/2ML IJ SOLN: 4.0000 mg | Freq: Four times a day (QID) | INTRAMUSCULAR | Status: DC | PRN

## 2019-11-13 MED ORDER — DOCUSATE SODIUM 100 MG PO CAPS: 100.0000 mg | ORAL_CAPSULE | Freq: Two times a day (BID) | ORAL | Status: DC | PRN

## 2019-11-13 MED ORDER — MUPIROCIN 2 % EX OINT
1.0000 "application " | TOPICAL_OINTMENT | Freq: Two times a day (BID) | CUTANEOUS | Status: DC
  Administered 2019-11-13: 1 via NASAL
  Filled 2019-11-13: qty 22

## 2019-11-13 MED ORDER — LACTATED RINGERS IV SOLN: INTRAVENOUS | Status: DC

## 2019-11-13 MED ORDER — ACETAMINOPHEN 325 MG PO TABS
650.0000 mg | ORAL_TABLET | Freq: Four times a day (QID) | ORAL | Status: DC | PRN
  Administered 2019-11-13 (×2): 650 mg via ORAL
  Filled 2019-11-13 (×2): qty 2

## 2019-11-13 MED ORDER — PIPERACILLIN-TAZOBACTAM 3.375 G IVPB
3.3750 g | Freq: Three times a day (TID) | INTRAVENOUS | Status: DC
  Administered 2019-11-13 – 2019-11-14 (×4): 3.375 g via INTRAVENOUS
  Filled 2019-11-13 (×4): qty 50

## 2019-11-13 MED ORDER — PIPERACILLIN-TAZOBACTAM 3.375 G IVPB 30 MIN
3.3750 g | Freq: Once | INTRAVENOUS | Status: AC
  Administered 2019-11-13: 3.375 g via INTRAVENOUS
  Filled 2019-11-13: qty 50

## 2019-11-13 MED ORDER — MORPHINE SULFATE (PF) 2 MG/ML IV SOLN: 2.0000 mg | INTRAVENOUS | Status: DC | PRN

## 2019-11-13 MED ORDER — SODIUM CHLORIDE 0.9 % IV BOLUS
1000.0000 mL | Freq: Once | INTRAVENOUS | Status: AC
  Administered 2019-11-13: 1000 mL via INTRAVENOUS

## 2019-11-13 MED ORDER — ONDANSETRON 4 MG PO TBDP
4.0000 mg | ORAL_TABLET | Freq: Four times a day (QID) | ORAL | Status: DC | PRN
  Filled 2019-11-13: qty 1

## 2019-11-13 MED ORDER — HYDROCODONE-ACETAMINOPHEN 5-325 MG PO TABS
1.0000 | ORAL_TABLET | ORAL | Status: DC | PRN
  Administered 2019-11-13: 1 via ORAL
  Filled 2019-11-13: qty 1

## 2019-11-13 MED ORDER — PIPERACILLIN-TAZOBACTAM 3.375 G IVPB 30 MIN
3.3750 g | Freq: Three times a day (TID) | INTRAVENOUS | Status: DC
  Filled 2019-11-13 (×2): qty 50

## 2019-11-13 NOTE — H&P (View-Only) (Signed)
Subjective:   CC: Acute appendicitis  HPI:  Johnny Stein is a 23 y.o. male who is consulted by Dolores Frame for evaluation of  above cc.  Symptoms were first noted a few days ago. Pain is sharp located in right lower quadrant associated with nausea, exacerbated by nothing specific.  Admitted couple weeks ago for same issue.  At the time due to the intensive inflammation, decision was made to proceed with antibiotic treatment alone and schedule a interval appendectomy which was scheduled for mid January.  Patient states that he was symptom-free during antibiotic course but when he completed the oral antibiotics, he started having the similar symptoms come back again.     Past Medical History: none reported  Past Surgical History:  has a past surgical history that includes Femur Surgery (Bilateral).  Family History: family history includes Diabetes in his father; Hypertension in his mother.  Social History:  reports that he has been smoking cigarettes. He has never used smokeless tobacco. He reports current alcohol use of about 1.0 standard drinks of alcohol per week. No history on file for drug.  Current Medications: Augmentin which she completed the course several days ago.  Allergies:  Allergies as of 11/12/2019  . (No Known Allergies)    ROS:  General: Denies weight loss, weight gain, fatigue, fevers, chills, and night sweats. Eyes: Denies blurry vision, double vision, eye pain, itchy eyes, and tearing. Ears: Denies hearing loss, earache, and ringing in ears. Nose: Denies sinus pain, congestion, infections, runny nose, and nosebleeds. Mouth/throat: Denies hoarseness, sore throat, bleeding gums, and difficulty swallowing. Heart: Denies chest pain, palpitations, racing heart, irregular heartbeat, leg pain or swelling, and decreased activity tolerance. Respiratory: Denies breathing difficulty, shortness of breath, wheezing, cough, and sputum. GI: Denies change in appetite, heartburn,  constipation, diarrhea, and blood in stool. GU: Denies difficulty urinating, pain with urinating, urgency, frequency, blood in urine. Musculoskeletal: Denies joint stiffness, pain, swelling, muscle weakness. Skin: Denies rash, itching, mass, tumors, sores, and boils Neurologic: Denies headache, fainting, dizziness, seizures, numbness, and tingling. Psychiatric: Denies depression, anxiety, difficulty sleeping, and memory loss. Endocrine: Denies heat or cold intolerance, and increased thirst or urination. Blood/lymph: Denies easy bruising, easy bruising, and swollen glands     Objective:     BP (!) 114/96 (BP Location: Right Arm)   Pulse 76   Temp 97.7 F (36.5 C) (Oral)   Resp 20   Ht 6' (1.829 m)   Wt 117.9 kg   SpO2 100%   BMI 35.26 kg/m    Constitutional :  alert, cooperative, appears stated age and no distress  Lymphatics/Throat:  no asymmetry, masses, or scars  Respiratory:  clear to auscultation bilaterally  Cardiovascular:  regular rate and rhythm  Gastrointestinal: Soft, no guarding, focal tenderness to palpation in right lower quadrant.   Musculoskeletal: Steady gait and movement  Skin: Cool and moist  Psychiatric: Normal affect, non-agitated, not confused       LABS:  CMP Latest Ref Rng & Units 11/12/2019 10/08/2019 10/07/2019  Glucose 70 - 99 mg/dL 409(W) 98 119(J)  BUN 6 - 20 mg/dL 13 12 11   Creatinine 0.61 - 1.24 mg/dL 4.78 2.95  Sodium 135 - 145 mmol/L 140 138 141  Potassium 3.5 - 5.1 mmol/L 3.8 3.6 4.1  Chloride 98 - 111 mmol/L 105 102 104  CO2 22 - 32 mmol/L 27 27 27   Calcium 8.9 - 10.3 mg/dL 9.0 6.21) 9.7  Total Protein 6.5 - 8.1 g/dL 7.5 - 7.8  Total Bilirubin 0.3 - 1.2 mg/dL 0.6 - 1.0  Alkaline Phos 38 - 126 U/L 67 - 56  AST 15 - 41 U/L 23 - 18  ALT 0 - 44 U/L 36 - 25   CBC Latest Ref Rng & Units 11/12/2019 10/09/2019 10/08/2019  WBC 4.0 - 10.5 K/uL 7.3 8.1 9.5  Hemoglobin 13.0 - 17.0 g/dL 15.9 15.4 15.6  Hematocrit 39.0 - 52.0 % 46.9  44.3 44.8  Platelets 150 - 400 K/uL 220 208 219     RADS: CLINICAL DATA:  Nausea, vomiting, diarrhea. Right-sided abdominal pain. History of perforated appendicitis last month, scheduled for laparoscopic appendectomy 11/28/2019.  EXAM: CT ABDOMEN AND PELVIS WITH CONTRAST  TECHNIQUE: Multidetector CT imaging of the abdomen and pelvis was performed using the standard protocol following bolus administration of intravenous contrast.  CONTRAST:  164mL OMNIPAQUE IOHEXOL 300 MG/ML  SOLN  COMPARISON:  Right upper quadrant ultrasound and CT 10/07/2019  FINDINGS: Lower chest: Lung bases are clear.  Hepatobiliary: No focal liver abnormality is seen. No gallstones, gallbladder wall thickening, or biliary dilatation.  Pancreas: No ductal dilatation or inflammation.  Spleen: Normal in size without focal abnormality.  Adrenals/Urinary Tract: Normal adrenal glands. No hydronephrosis or perinephric edema. Homogeneous renal enhancement. Urinary bladder is physiologically distended without wall thickening.  Stomach/Bowel: Dilated appendix measuring 10 mm. Persistent but improved periappendiceal fat stranding and free fluid in the right pericolic gutter. Foci of adjacent free air have resolved. There is no organized fluid collection or focal abscess. No appendicolith. No associated cecal air colonic wall thickening. Small bowel is normal. Stomach physiologically distended. Small to moderate colonic stool burden.  Vascular/Lymphatic: Persistent but improving ileocolic adenopathy, largest node measures 6 mm currently. No new adenopathy. Portal vein and mesenteric vessels are patent. Normal caliber abdominal aorta.  Reproductive: Prostate is unremarkable.  Other: No intra-abdominal abscess. Persistent but improved inflammatory change in free fluid in the right lower quadrant/pericolic gutter. Free air has resolved since prior exam. Tiny fat containing umbilical  hernia.  Musculoskeletal: There are no acute or suspicious osseous abnormalities.  IMPRESSION: 1. No evidence of periappendiceal or intra-abdominal abscess after recent perforated appendicitis. Appendix remains dilated with persistent or recurrent surrounding inflammation and small volume free fluid in the right lower quadrant/pericolic gutter. Free fluid has improved from previous. Previous free air has resolved. 2. Persistent but improving ileocolic adenopathy, likely reactive.   Electronically Signed   By: Keith Rake M.D.   On: 11/13/2019 01:11 Assessment:      Recurrent acute appendicitis  Plan:     No signs of peritonitis, with normal white count, and improving appendicitis based on most recent imaging.  No need for urgent surgery at this time.  Will admit, IV antibiotics, keep n.p.o., and proceed with laparoscopic appendectomy after discussion with partners to decide who will be performing the procedure.  Patient verbalized understanding and is agreeable to plan at this time.

## 2019-11-13 NOTE — ED Provider Notes (Signed)
[facility] Emergency Department Provider Note   ____________________________________________   First MD Initiated Contact with Patient 11/13/19 [phone]     (approximate)  I have reviewed the triage vital signs and the nursing notes.   HISTORY  Chief Complaint Abdominal Pain    HPI Johnny Stein is a 23 y.o. male who presents to the ED from home with a chief complaint of abdominal pain, nausea and vomiting.  Patient was treated by Dr. Lady Gary from general surgery in late November for perforated appendicitis treated with IV antibiotics.  Scheduled for lap appendectomy November 28, 2019.  Finished his antibiotics 2 weeks ago.  4 days ago he started to redevelop pain in his right lower abdomen associated with generalized malaise, nausea and vomiting.  Called the surgery office yesterday afternoon and was told to come to the ED for repeat CT scan.  Denies fever, cough, chest pain, shortness of breath, dysuria, diarrhea.       Past medical history Perforated appendicitis  Patient Active Problem List   Diagnosis Date Noted  . Acute perforated appendicitis 10/08/2019  . Ingrown toenail 02/11/2016  . Abdominal pain 10/29/2015    Past Surgical History:  Procedure Laterality Date  . FEMUR SURGERY Bilateral     Prior to Admission medications   Medication Sig Start Date End Date Taking? Authorizing Provider  amoxicillin-clavulanate (AUGMENTIN) 875-125 MG tablet Take 1 tablet by mouth 2 (two) times daily.    [provider]    Allergies Patient has no known allergies.  Family History  Problem Relation Age of Onset  . Hypertension Mother   . Diabetes Father     Social History Social History   Tobacco Use  . Smoking status: Light Tobacco Smoker    Types: Cigarettes  . Smokeless tobacco: Never Used  Substance Use Topics  . Alcohol use: Yes    Alcohol/week: 1.0 standard drinks    Types: 1 Shots of liquor per week  . Drug use: Not on file      Review of Systems  Constitutional: No fever/chills Eyes: No visual changes. ENT: No sore throat. Cardiovascular: Denies chest pain. Respiratory: Denies shortness of breath. Gastrointestinal: Positive for abdominal pain, nausea and vomiting.  No diarrhea.  No constipation. Genitourinary: Negative for dysuria. Musculoskeletal: Negative for back pain. Skin: Negative for rash. Neurological: Negative for headaches, focal weakness or numbness.   ____________________________________________   PHYSICAL EXAM:  VITAL SIGNS: ED Triage Vitals  Enc Vitals Group     BP 11/12/19 1938 (!) 171/89     Pulse Rate 11/12/19 1938 95     Resp 11/12/19 1938 18     Temp 11/12/19 1938 98.5 F (36.9 C)     Temp Source 11/12/19 1938 Oral     SpO2 11/12/19 1938 97 %     Weight 11/12/19 1936 260 lb (117.9 kg)     Height 11/12/19 1936 6' (1.829 m)     Head Circumference --      Peak Flow --      Pain Score 11/12/19 1936 4     Pain Loc --      Pain Edu? --      Excl. in GC? --     Constitutional: Alert and oriented.  Uncomfortable appearing and in mild acute distress. Eyes: Conjunctivae are normal. PERRL. EOMI. Head: Atraumatic. Nose: No congestion/rhinnorhea. Mouth/Throat: Mucous membranes are moist.  Oropharynx non-erythematous. Neck: No stridor.   Cardiovascular: Normal rate, regular rhythm. Grossly normal heart sounds.  Good peripheral circulation. Respiratory: Normal respiratory effort.  No retractions. Lungs CTAB. Gastrointestinal: Soft and mildly tender to palpation right lower quadrant at McBurney's point. No distention. No abdominal bruits. No CVA tenderness. Musculoskeletal: No lower extremity tenderness nor edema.  No joint effusions. Neurologic:  Normal speech and language. No gross focal neurologic deficits are appreciated. No gait instability. Skin:  Skin is warm, dry and intact. No rash noted. Psychiatric: Mood and affect are normal. Speech and behavior are  normal.  ____________________________________________   LABS (all labs ordered are listed, but only abnormal results are displayed)  Labs Reviewed  COMPREHENSIVE METABOLIC PANEL - Abnormal; Notable for the following components:      Result Value   Glucose, Bld 124 (*)    All other components within normal limits  URINALYSIS, COMPLETE (UACMP) WITH MICROSCOPIC - Abnormal; Notable for the following components:   Color, Urine YELLOW (*)    APPearance CLEAR (*)    All other components within normal limits  LIPASE, BLOOD  CBC   ____________________________________________  EKG  None ____________________________________________  RADIOLOGY  ED MD interpretation: Persistently dilated appendix  Official radiology report(s): CT Abdomen Pelvis W Contrast  Result Date: 11/13/2019 CLINICAL DATA:  Nausea, vomiting, diarrhea. Right-sided abdominal pain. History of perforated appendicitis last month, scheduled for laparoscopic appendectomy 11/28/2019. EXAM: CT ABDOMEN AND PELVIS WITH CONTRAST TECHNIQUE: Multidetector CT imaging of the abdomen and pelvis was performed using the standard protocol following bolus administration of intravenous contrast. CONTRAST:  100mL OMNIPAQUE IOHEXOL 300 MG/ML  SOLN COMPARISON:  Right upper quadrant ultrasound and CT 10/07/2019 FINDINGS: Lower chest: Lung bases are clear. Hepatobiliary: No focal liver abnormality is seen. No gallstones, gallbladder wall thickening, or biliary dilatation. Pancreas: No ductal dilatation or inflammation. Spleen: Normal in size without focal abnormality. Adrenals/Urinary Tract: Normal adrenal glands. No hydronephrosis or perinephric edema. Homogeneous renal enhancement. Urinary bladder is physiologically distended without wall thickening. Stomach/Bowel: Dilated appendix measuring 10 mm. Persistent but improved periappendiceal fat stranding and free fluid in the right pericolic gutter. Foci of adjacent free air have resolved. There is no  organized fluid collection or focal abscess. No appendicolith. No associated cecal air colonic wall thickening. Small bowel is normal. Stomach physiologically distended. Small to moderate colonic stool burden. Vascular/Lymphatic: Persistent but improving ileocolic adenopathy, largest node measures 6 mm currently. No new adenopathy. Portal vein and mesenteric vessels are patent. Normal caliber abdominal aorta. Reproductive: Prostate is unremarkable. Other: No intra-abdominal abscess. Persistent but improved inflammatory change in free fluid in the right lower quadrant/pericolic gutter. Free air has resolved since prior exam. Tiny fat containing umbilical hernia. Musculoskeletal: There are no acute or suspicious osseous abnormalities. IMPRESSION: 1. No evidence of periappendiceal or intra-abdominal abscess after recent perforated appendicitis. Appendix remains dilated with persistent or recurrent surrounding inflammation and small volume free fluid in the right lower quadrant/pericolic gutter. Free fluid has improved from previous. Previous free air has resolved. 2. Persistent but improving ileocolic adenopathy, likely reactive. Electronically Signed   By: Narda RutherfordMelanie  Sanford M.D.   On: 11/13/2019 01:11    ____________________________________________   PROCEDURES  Procedure(s) performed (including Critical Care):  Procedures   ____________________________________________   INITIAL IMPRESSION / ASSESSMENT AND PLAN / ED COURSE  As part of my medical decision making, I reviewed the following data within the electronic MEDICAL RECORD NUMBER Nursing notes reviewed and incorporated, Labs reviewed, Old chart reviewed, Radiograph reviewed, A consult was requested and obtained from this/these consultant(s) Surgery and Notes from prior ED visits  Johnny Stein was evaluated in Emergency Department on 11/13/2019 for the symptoms described in the history of present illness. He was evaluated in the context of  the global COVID-19 pandemic, which necessitated consideration that the patient might be at risk for infection with the SARS-CoV-2 virus that causes COVID-19. Institutional protocols and algorithms that pertain to the evaluation of patients at risk for COVID-19 are in a state of rapid change based on information released by regulatory bodies including the CDC and federal and state organizations. These policies and algorithms were followed during the patient's care in the ED.    23 year old male who presents with right lower quadrant abdominal pain, nausea and vomiting. Differential diagnosis includes, but is not limited to, acute appendicitis, renal colic, testicular torsion, urinary tract infection/pyelonephritis, prostatitis,  epididymitis, diverticulitis, small bowel obstruction or ileus, colitis, abdominal aortic aneurysm, gastroenteritis, hernia, etc.  Patient with perforated appendicitis in late November treated with IV antibiotics and scheduled for laparoscopic appendectomy in 2 weeks.  Return of pain, nausea and vomiting.  CT scan demonstrates persistently dilated and inflamed appendix with improvement in free fluid.  Will administer IV fluids, analgesia, antiemetic and discuss with surgeon.   Clinical Course as of Nov 12 234  Wed Nov 13, 2019  0235 Discussed case with Dr. Lysle Pearl from general surgery who will evaluate patient in the emergency department for admission.   [JS]    Clinical Course User Index [JS] Paulette Blanch, MD     ____________________________________________   FINAL CLINICAL IMPRESSION(S) / ED DIAGNOSES  Final diagnoses:  Right lower quadrant abdominal pain  Acute appendicitis, unspecified acute appendicitis type     ED Discharge Orders    None       Note:  This document was prepared using Dragon voice recognition software and may include unintentional dictation errors.   Paulette Blanch, MD 11/13/19 [phone]

## 2019-11-13 NOTE — Plan of Care (Signed)

## 2019-11-13 NOTE — H&P (Signed)
Subjective:   CC: Acute appendicitis  HPI:  Johnny Stein is a 23 y.o. male who is consulted by Dolores Frame for evaluation of  above cc.  Symptoms were first noted a few days ago. Pain is sharp located in right lower quadrant associated with nausea, exacerbated by nothing specific.  Admitted couple weeks ago for same issue.  At the time due to the intensive inflammation, decision was made to proceed with antibiotic treatment alone and schedule a interval appendectomy which was scheduled for mid January.  Patient states that he was symptom-free during antibiotic course but when he completed the oral antibiotics, he started having the similar symptoms come back again.     Past Medical History: none reported  Past Surgical History:  has a past surgical history that includes Femur Surgery (Bilateral).  Family History: family history includes Diabetes in his father; Hypertension in his mother.  Social History:  reports that he has been smoking cigarettes. He has never used smokeless tobacco. He reports current alcohol use of about 1.0 standard drinks of alcohol per week. No history on file for drug.  Current Medications: Augmentin which she completed the course several days ago.  Allergies:  Allergies as of 11/12/2019  . (No Known Allergies)    ROS:  General: Denies weight loss, weight gain, fatigue, fevers, chills, and night sweats. Eyes: Denies blurry vision, double vision, eye pain, itchy eyes, and tearing. Ears: Denies hearing loss, earache, and ringing in ears. Nose: Denies sinus pain, congestion, infections, runny nose, and nosebleeds. Mouth/throat: Denies hoarseness, sore throat, bleeding gums, and difficulty swallowing. Heart: Denies chest pain, palpitations, racing heart, irregular heartbeat, leg pain or swelling, and decreased activity tolerance. Respiratory: Denies breathing difficulty, shortness of breath, wheezing, cough, and sputum. GI: Denies change in appetite, heartburn,  constipation, diarrhea, and blood in stool. GU: Denies difficulty urinating, pain with urinating, urgency, frequency, blood in urine. Musculoskeletal: Denies joint stiffness, pain, swelling, muscle weakness. Skin: Denies rash, itching, mass, tumors, sores, and boils Neurologic: Denies headache, fainting, dizziness, seizures, numbness, and tingling. Psychiatric: Denies depression, anxiety, difficulty sleeping, and memory loss. Endocrine: Denies heat or cold intolerance, and increased thirst or urination. Blood/lymph: Denies easy bruising, easy bruising, and swollen glands     Objective:     BP (!) 114/96 (BP Location: Right Arm)   Pulse 76   Temp 97.7 F (36.5 C) (Oral)   Resp 20   Ht 6' (1.829 m)   Wt 117.9 kg   SpO2 100%   BMI 35.26 kg/m    Constitutional :  alert, cooperative, appears stated age and no distress  Lymphatics/Throat:  no asymmetry, masses, or scars  Respiratory:  clear to auscultation bilaterally  Cardiovascular:  regular rate and rhythm  Gastrointestinal: Soft, no guarding, focal tenderness to palpation in right lower quadrant.   Musculoskeletal: Steady gait and movement  Skin: Cool and moist  Psychiatric: Normal affect, non-agitated, not confused       LABS:  CMP Latest Ref Rng & Units 11/12/2019 10/08/2019 10/07/2019  Glucose 70 - 99 mg/dL 409(W) 98 119(J)  BUN 6 - 20 mg/dL 13 12 11   Creatinine 0.61 - 1.24 mg/dL 4.78 2.95  Sodium 135 - 145 mmol/L 140 138 141  Potassium 3.5 - 5.1 mmol/L 3.8 3.6 4.1  Chloride 98 - 111 mmol/L 105 102 104  CO2 22 - 32 mmol/L 27 27 27   Calcium 8.9 - 10.3 mg/dL 9.0 6.21) 9.7  Total Protein 6.5 - 8.1 g/dL 7.5 - 7.8  Total Bilirubin 0.3 - 1.2 mg/dL 0.6 - 1.0  Alkaline Phos 38 - 126 U/L 67 - 56  AST 15 - 41 U/L 23 - 18  ALT 0 - 44 U/L 36 - 25   CBC Latest Ref Rng & Units 11/12/2019 10/09/2019 10/08/2019  WBC 4.0 - 10.5 K/uL 7.3 8.1 9.5  Hemoglobin 13.0 - 17.0 g/dL 15.9 15.4 15.6  Hematocrit 39.0 - 52.0 % 46.9  44.3 44.8  Platelets 150 - 400 K/uL 220 208 219     RADS: CLINICAL DATA:  Nausea, vomiting, diarrhea. Right-sided abdominal pain. History of perforated appendicitis last month, scheduled for laparoscopic appendectomy 11/28/2019.  EXAM: CT ABDOMEN AND PELVIS WITH CONTRAST  TECHNIQUE: Multidetector CT imaging of the abdomen and pelvis was performed using the standard protocol following bolus administration of intravenous contrast.  CONTRAST:  164mL OMNIPAQUE IOHEXOL 300 MG/ML  SOLN  COMPARISON:  Right upper quadrant ultrasound and CT 10/07/2019  FINDINGS: Lower chest: Lung bases are clear.  Hepatobiliary: No focal liver abnormality is seen. No gallstones, gallbladder wall thickening, or biliary dilatation.  Pancreas: No ductal dilatation or inflammation.  Spleen: Normal in size without focal abnormality.  Adrenals/Urinary Tract: Normal adrenal glands. No hydronephrosis or perinephric edema. Homogeneous renal enhancement. Urinary bladder is physiologically distended without wall thickening.  Stomach/Bowel: Dilated appendix measuring 10 mm. Persistent but improved periappendiceal fat stranding and free fluid in the right pericolic gutter. Foci of adjacent free air have resolved. There is no organized fluid collection or focal abscess. No appendicolith. No associated cecal air colonic wall thickening. Small bowel is normal. Stomach physiologically distended. Small to moderate colonic stool burden.  Vascular/Lymphatic: Persistent but improving ileocolic adenopathy, largest node measures 6 mm currently. No new adenopathy. Portal vein and mesenteric vessels are patent. Normal caliber abdominal aorta.  Reproductive: Prostate is unremarkable.  Other: No intra-abdominal abscess. Persistent but improved inflammatory change in free fluid in the right lower quadrant/pericolic gutter. Free air has resolved since prior exam. Tiny fat containing umbilical  hernia.  Musculoskeletal: There are no acute or suspicious osseous abnormalities.  IMPRESSION: 1. No evidence of periappendiceal or intra-abdominal abscess after recent perforated appendicitis. Appendix remains dilated with persistent or recurrent surrounding inflammation and small volume free fluid in the right lower quadrant/pericolic gutter. Free fluid has improved from previous. Previous free air has resolved. 2. Persistent but improving ileocolic adenopathy, likely reactive.   Electronically Signed   By: Keith Rake M.D.   On: 11/13/2019 01:11 Assessment:      Recurrent acute appendicitis  Plan:     No signs of peritonitis, with normal white count, and improving appendicitis based on most recent imaging.  No need for urgent surgery at this time.  Will admit, IV antibiotics, keep n.p.o., and proceed with laparoscopic appendectomy after discussion with partners to decide who will be performing the procedure.  Patient verbalized understanding and is agreeable to plan at this time.

## 2019-11-14 LAB — BASIC METABOLIC PANEL
Anion gap: 8 (ref 5–15)
BUN: 12 mg/dL (ref 6–20)
CO2: 27 mmol/L (ref 22–32)
Calcium: 8.7 mg/dL — ABNORMAL LOW (ref 8.9–10.3)
Chloride: 106 mmol/L (ref 98–111)
Creatinine, Ser: 0.84 mg/dL (ref 0.61–1.24)
GFR calc Af Amer: >60 mL/min (ref >60)
GFR calc non Af Amer: >60 mL/min (ref >60)
Glucose, Bld: 100 mg/dL — ABNORMAL HIGH (ref 70–99)
Potassium: 3.7 mmol/L (ref 3.5–5.1)
Sodium: 141 mmol/L (ref 135–145)

## 2019-11-14 LAB — CBC
HCT: 45.3 % (ref 39.0–52.0)
Hemoglobin: 15.2 g/dL (ref 13.0–17.0)
MCH: 29 pg (ref 26.0–34.0)
MCHC: 33.6 g/dL (ref 30.0–36.0)
MCV: 86.3 fL (ref 80.0–100.0)
Platelets: 206 10*3/uL (ref 150–400)
RBC: 5.25 MIL/uL (ref 4.22–5.81)
RDW: 11.9 % (ref 11.5–15.5)
WBC: 6.2 10*3/uL (ref 4.0–10.5)
nRBC: 0 % (ref 0.0–0.2)

## 2019-11-14 LAB — PHOSPHORUS: Phosphorus: 4.6 mg/dL (ref 2.5–4.6)

## 2019-11-14 LAB — MAGNESIUM: Magnesium: 2.2 mg/dL (ref 1.7–2.4)

## 2019-11-14 LAB — SURGICAL PCR SCREEN
MRSA, PCR: NEGATIVE
Staphylococcus aureus: NEGATIVE

## 2019-11-14 MED ORDER — AMOXICILLIN-POT CLAVULANATE 875-125 MG PO TABS: 1.0000 | ORAL_TABLET | Freq: Two times a day (BID) | ORAL | 0 refills | Status: DC

## 2019-11-14 MED ORDER — ACETAMINOPHEN 325 MG PO TABS: 650.0000 mg | ORAL_TABLET | Freq: Three times a day (TID) | ORAL | 0 refills | Status: AC | PRN

## 2019-11-14 MED ORDER — IBUPROFEN 800 MG PO TABS: 800.0000 mg | ORAL_TABLET | Freq: Three times a day (TID) | ORAL | 0 refills | Status: DC | PRN

## 2019-11-14 MED ORDER — HYDROCODONE-ACETAMINOPHEN 5-325 MG PO TABS: 1.0000 | ORAL_TABLET | Freq: Four times a day (QID) | ORAL | 0 refills | Status: DC | PRN

## 2019-11-14 MED ORDER — DOCUSATE SODIUM 100 MG PO CAPS: 100.0000 mg | ORAL_CAPSULE | Freq: Two times a day (BID) | ORAL | 0 refills | Status: AC | PRN

## 2019-11-14 NOTE — Discharge Summary (Signed)
Physician Discharge Summary  Patient ID: Johnny Stein MRN: [id number] DOB/AGE: 05-Feb-1996 23 y.o.  Admit date: 11/13/2019 Discharge date: 11/14/2019  Admission Diagnoses: Chronic appendicitis  Discharge Diagnoses:  Same as above  Discharged Condition: good  Hospital Course: Admitted for above.  Patient started on IV antibiotics and symptom improvement noted.  After discussion with original surgeon, plan is to stick with original OR date for interval appendectomy.  We will continue antibiotics until the surgery date.  Patient agreeable to plan at this time.  Consults: None  Discharge Exam: Blood pressure 121/68, pulse (!) 57, temperature 97.6 F (36.4 C), temperature source Oral, resp. rate 18, height 6' (1.829 m), weight 118.8 kg, SpO2 99 %. General appearance: alert, cooperative and no distress GI: soft, non-tender; bowel sounds normal; no masses,  no organomegaly  Disposition:  Discharge disposition: 01-Home or Self Care       Discharge Instructions    Discharge patient   Complete by: As directed    Discharge disposition: 01-Home or Self Care   Discharge patient date: 11/14/2019     Allergies as of 11/14/2019   No Known Allergies     Medication List    TAKE these medications   acetaminophen 325 MG tablet Commonly known as: Tylenol Take 2 tablets (650 mg total) by mouth every 8 (eight) hours as needed for mild pain.   amoxicillin-clavulanate 875-125 MG tablet Commonly known as: AUGMENTIN Take 1 tablet by mouth 2 (two) times daily for 18 days.   docusate sodium 100 MG capsule Commonly known as: Colace Take 1 capsule (100 mg total) by mouth 2 (two) times daily as needed for up to 10 days for mild constipation.   HYDROcodone-acetaminophen 5-325 MG tablet Commonly known as: Norco Take 1 tablet by mouth every 6 (six) hours as needed for up to 6 doses for moderate pain.   ibuprofen 800 MG tablet Commonly known as: ADVIL Take 1 tablet (800 mg total) by  mouth every 8 (eight) hours as needed for mild pain or moderate pain.         Total time spent arranging discharge was >68min. Signed: Benjamine Sprague 11/14/2019, 3:04 PM

## 2019-11-14 NOTE — Discharge Instructions (Signed)
appendicitis, Care After Activity  Do not drive or use heavy machinery while taking prescription pain medicine.  Do not play contact sports until your doctor says it is okay.  Do not drive for 24 hours if you were given a medicine to help you relax (sedative).  General instructions .  tylenol and advil as needed for discomfort.  Please alternate between the two every four hours as needed for pain.   .  Use narcotics, if prescribed, only when tylenol and motrin is not enough to control pain. .  325-650mg  every 8hrs to max of 3000mg /24hrs (including the 325mg  in every norco dose) for the tylenol.   .  Advil up to 800mg  per dose every 8hrs as needed for pain.    To prevent or treat constipation while you are taking prescription pain medicine, your doctor may recommend that you: ? Drink enough fluid to keep your pee (urine) clear or pale yellow. ? Take over-the-counter or prescription medicines. ? Eat foods that are high in fiber, such as fresh fruits and vegetables, whole grains, and beans. ? Limit foods that are high in fat and processed sugars, such as fried and sweet foods. Get help right away if:  You have trouble breathing.  You have chest pain.  You have pain that is getting worse in your shoulders.  You faint or feel dizzy when you stand.  You have very bad pain in your belly (abdomen).  You are sick to your stomach (nauseous) for more than one day.  You have throwing up (vomiting) that lasts for more than one day.  You have leg pain. This information is not intended to replace advice given to you by your health care provider. Make sure you discuss any questions you have with your health care provider. Document Released: 08/09/2008 Document Revised: 05/21/2016 Document Reviewed: 04/18/2016 Elsevier Interactive Patient Education  2019 [employer].

## 2019-11-27 ENCOUNTER — Encounter
Admission: RE | Admit: 2019-11-27 | Discharge: 2019-11-27 | Disposition: A | Discharge status: Home / Self Care | Payer: BC Managed Care – PPO | Source: Ambulatory Visit | Attending: General Surgery | Admitting: General Surgery

## 2019-11-27 ENCOUNTER — Other Ambulatory Visit: Payer: Self-pay

## 2019-11-27 ENCOUNTER — Telehealth: Payer: Self-pay | Admitting: General Surgery

## 2019-11-27 HISTORY — DX: Gastro-esophageal reflux disease without esophagitis: K21.9

## 2019-11-27 HISTORY — DX: Irritable bowel syndrome, unspecified: K58.9

## 2019-11-27 NOTE — Patient Instructions (Signed)
Your procedure is scheduled on: 12/02/19 Report to DAY SURGERY DEPARTMENT LOCATED ON 2ND FLOOR MEDICAL MALL ENTRANCE. To find out your arrival time please call [phone] between 1PM - 3PM on 11/29/19.  Remember: Instructions that are not followed completely may result in serious medical risk, up to and including death, or upon the discretion of your surgeon and anesthesiologist your surgery may need to be rescheduled.     _X__ 1. Do not eat food after midnight the night before your procedure.                 No gum chewing or hard candies. You may drink clear liquids up to 2 hours                 before you are scheduled to arrive for your surgery- DO not drink clear                 liquids within 2 hours of the start of your surgery.                 Clear Liquids include:  water, apple juice without pulp, clear carbohydrate                 drink such as Clearfast or Gatorade, Black Coffee or Tea (Do not add                 anything to coffee or tea). Diabetics water only  __X__2.  On the morning of surgery brush your teeth with toothpaste and water, you                 may rinse your mouth with mouthwash if you wish.  Do not swallow any              toothpaste of mouthwash.     _X__ 3.  No Alcohol for 24 hours before or after surgery.   _X__ 4.  Do Not Smoke or use e-cigarettes For 24 Hours Prior to Your Surgery.                 Do not use any chewable tobacco products for at least 6 hours prior to                 surgery.  ____  5.  Bring all medications with you on the day of surgery if instructed.   __X__  6.  Notify your doctor if there is any change in your medical condition      (cold, fever, infections).     Do not wear jewelry, make-up, hairpins, clips or nail polish. Do not wear lotions, powders, or perfumes.  Do not shave 48 hours prior to surgery. Men may shave face and neck. Do not bring valuables to the hospital.    [facility] is not responsible for any belongings or  valuables.  Contacts, dentures/partials or body piercings may not be worn into surgery. Bring a case for your contacts, glasses or hearing aids, a denture cup will be supplied. Leave your suitcase in the car. After surgery it may be brought to your room. For patients admitted to the hospital, discharge time is determined by your treatment team.   Patients discharged the day of surgery will not be allowed to drive home.   Please read over the following fact sheets that you were given:   MRSA Information  __X__ Take these medicines the morning of surgery with A SIP OF WATER:      1. none  2.   3.   4.  5.  6.  ____ Fleet Enema (as directed)   __X__ Use CHG Soap/SAGE wipes as directed  ____ Use inhalers on the day of surgery  ____ Stop metformin/Janumet/Farxiga 2 days prior to surgery    ____ Take 1/2 of usual insulin dose the night before surgery. No insulin the morning          of surgery.   ____ Stop Blood Thinners Coumadin/Plavix/Xarelto/Pleta/Pradaxa/Eliquis/Effient/Aspirin  on   Or contact your Surgeon, Cardiologist or Medical Doctor regarding  ability to stop your blood thinners  __X__ Stop Anti-inflammatories 7 days before surgery such as Advil, Ibuprofen, Motrin,  BC or Goodies Powder, Naprosyn, Naproxen, Aleve, Aspirin    __X__ Stop all herbal supplements, fish oil or vitamin E until after surgery.    ____ Bring C-Pap to the hospital.

## 2019-11-27 NOTE — Telephone Encounter (Signed)
Patient called and was given surgery information too.

## 2019-11-28 ENCOUNTER — Ambulatory Visit: Payer: Self-pay | Admitting: General Surgery

## 2019-11-28 ENCOUNTER — Other Ambulatory Visit
Admission: RE | Admit: 2019-11-28 | Discharge: 2019-11-28 | Disposition: A | Discharge status: Home / Self Care | Payer: BC Managed Care – PPO | Source: Ambulatory Visit | Attending: General Surgery | Admitting: General Surgery

## 2019-11-28 DIAGNOSIS — Z01812 Encounter for preprocedural laboratory examination: Secondary | ICD-10-CM | POA: Insufficient documentation

## 2019-11-28 DIAGNOSIS — Z20822 Contact with and (suspected) exposure to covid-19: Secondary | ICD-10-CM | POA: Insufficient documentation

## 2019-11-28 LAB — SARS CORONAVIRUS 2 (TAT 6-24 HRS): SARS Coronavirus 2: NEGATIVE

## 2019-12-02 ENCOUNTER — Ambulatory Visit: Payer: BC Managed Care – PPO | Admitting: Registered Nurse

## 2019-12-02 ENCOUNTER — Encounter: Payer: Self-pay | Admitting: General Surgery

## 2019-12-02 ENCOUNTER — Ambulatory Visit
Admission: RE | Admit: 2019-12-02 | Discharge: 2019-12-02 | Disposition: A | Discharge status: Home / Self Care | Payer: BC Managed Care – PPO | Attending: General Surgery | Admitting: General Surgery

## 2019-12-02 ENCOUNTER — Encounter
Admission: RE | Disposition: A | Discharge status: Home / Self Care | Payer: Self-pay | Source: Home / Self Care | Attending: General Surgery

## 2019-12-02 ENCOUNTER — Other Ambulatory Visit: Payer: Self-pay

## 2019-12-02 DIAGNOSIS — F17210 Nicotine dependence, cigarettes, uncomplicated: Secondary | ICD-10-CM | POA: Diagnosis not present

## 2019-12-02 DIAGNOSIS — K3532 Acute appendicitis with perforation and localized peritonitis, without abscess: Secondary | ICD-10-CM

## 2019-12-02 DIAGNOSIS — K3533 Acute appendicitis with perforation and localized peritonitis, with abscess: Secondary | ICD-10-CM | POA: Insufficient documentation

## 2019-12-02 DIAGNOSIS — K352 Acute appendicitis with generalized peritonitis, without abscess: Secondary | ICD-10-CM | POA: Diagnosis present

## 2019-12-02 HISTORY — PX: LAPAROSCOPIC APPENDECTOMY: SHX408

## 2019-12-02 LAB — URINE DRUG SCREEN, QUALITATIVE (ARMC ONLY)
Amphetamines, Ur Screen: NOT DETECTED
Barbiturates, Ur Screen: NOT DETECTED
Benzodiazepine, Ur Scrn: NOT DETECTED
Cannabinoid 50 Ng, Ur ~~LOC~~: POSITIVE — AB
Cocaine Metabolite,Ur ~~LOC~~: NOT DETECTED
MDMA (Ecstasy)Ur Screen: NOT DETECTED
Methadone Scn, Ur: NOT DETECTED
Opiate, Ur Screen: NOT DETECTED
Phencyclidine (PCP) Ur S: NOT DETECTED
Tricyclic, Ur Screen: NOT DETECTED

## 2019-12-02 SURGERY — APPENDECTOMY, LAPAROSCOPIC
Anesthesia: General

## 2019-12-02 MED ORDER — OXYCODONE HCL 5 MG PO TABS
ORAL_TABLET | ORAL | Status: AC
  Administered 2019-12-02: 5 mg via ORAL
  Filled 2019-12-02: qty 1

## 2019-12-02 MED ORDER — DEXAMETHASONE SODIUM PHOSPHATE 10 MG/ML IJ SOLN
INTRAMUSCULAR | Status: AC
  Filled 2019-12-02: qty 1

## 2019-12-02 MED ORDER — LACTATED RINGERS IV SOLN: INTRAVENOUS | Status: DC

## 2019-12-02 MED ORDER — CHLORHEXIDINE GLUCONATE CLOTH 2 % EX PADS: 6.0000 | MEDICATED_PAD | Freq: Once | CUTANEOUS | Status: DC

## 2019-12-02 MED ORDER — FAMOTIDINE 20 MG PO TABS
ORAL_TABLET | ORAL | Status: AC
  Administered 2019-12-02: 20 mg via ORAL
  Filled 2019-12-02: qty 1

## 2019-12-02 MED ORDER — FENTANYL CITRATE (PF) 100 MCG/2ML IJ SOLN
INTRAMUSCULAR | Status: AC
  Filled 2019-12-02: qty 2

## 2019-12-02 MED ORDER — BUPIVACAINE HCL (PF) 0.25 % IJ SOLN
INTRAMUSCULAR | Status: AC
  Filled 2019-12-02: qty 30

## 2019-12-02 MED ORDER — ONDANSETRON HCL 4 MG/2ML IJ SOLN
4.0000 mg | Freq: Once | INTRAMUSCULAR | Status: AC | PRN
  Administered 2019-12-02: 4 mg via INTRAVENOUS

## 2019-12-02 MED ORDER — GABAPENTIN 300 MG PO CAPS
ORAL_CAPSULE | ORAL | Status: AC
  Administered 2019-12-02: 300 mg via ORAL
  Filled 2019-12-02: qty 1

## 2019-12-02 MED ORDER — MIDAZOLAM HCL 2 MG/2ML IJ SOLN
INTRAMUSCULAR | Status: DC | PRN
  Administered 2019-12-02: 2 mg via INTRAVENOUS

## 2019-12-02 MED ORDER — IBUPROFEN 800 MG PO TABS: 800.0000 mg | ORAL_TABLET | Freq: Three times a day (TID) | ORAL | 0 refills | Status: DC | PRN

## 2019-12-02 MED ORDER — FENTANYL CITRATE (PF) 100 MCG/2ML IJ SOLN
INTRAMUSCULAR | Status: DC | PRN
  Administered 2019-12-02: 100 ug via INTRAVENOUS

## 2019-12-02 MED ORDER — GABAPENTIN 300 MG PO CAPS: 300.0000 mg | ORAL_CAPSULE | ORAL | Status: AC

## 2019-12-02 MED ORDER — DEXMEDETOMIDINE HCL 200 MCG/2ML IV SOLN
INTRAVENOUS | Status: DC | PRN
  Administered 2019-12-02: 8 ug via INTRAVENOUS
  Administered 2019-12-02: 12 ug via INTRAVENOUS

## 2019-12-02 MED ORDER — LIDOCAINE HCL (CARDIAC) PF 100 MG/5ML IV SOSY
PREFILLED_SYRINGE | INTRAVENOUS | Status: DC | PRN
  Administered 2019-12-02: 60 mg via INTRAVENOUS

## 2019-12-02 MED ORDER — CELECOXIB 200 MG PO CAPS: 200.0000 mg | ORAL_CAPSULE | ORAL | Status: AC

## 2019-12-02 MED ORDER — SUGAMMADEX SODIUM 500 MG/5ML IV SOLN
INTRAVENOUS | Status: AC
  Filled 2019-12-02: qty 5

## 2019-12-02 MED ORDER — DEXMEDETOMIDINE HCL IN NACL 80 MCG/20ML IV SOLN
INTRAVENOUS | Status: AC
  Filled 2019-12-02: qty 20

## 2019-12-02 MED ORDER — ACETAMINOPHEN 500 MG PO TABS: 1000.0000 mg | ORAL_TABLET | ORAL | Status: AC

## 2019-12-02 MED ORDER — PROPOFOL 10 MG/ML IV BOLUS
INTRAVENOUS | Status: AC
  Filled 2019-12-02: qty 20

## 2019-12-02 MED ORDER — CHLORHEXIDINE GLUCONATE CLOTH 2 % EX PADS
6.0000 | MEDICATED_PAD | Freq: Once | CUTANEOUS | Status: AC
  Administered 2019-12-02: 09:00:00 6 via TOPICAL

## 2019-12-02 MED ORDER — FENTANYL CITRATE (PF) 100 MCG/2ML IJ SOLN
INTRAMUSCULAR | Status: AC
  Administered 2019-12-02: 25 ug via INTRAVENOUS
  Filled 2019-12-02: qty 2

## 2019-12-02 MED ORDER — FAMOTIDINE 20 MG PO TABS: 20.0000 mg | ORAL_TABLET | Freq: Once | ORAL | Status: AC

## 2019-12-02 MED ORDER — DEXAMETHASONE SODIUM PHOSPHATE 10 MG/ML IJ SOLN
INTRAMUSCULAR | Status: DC | PRN
  Administered 2019-12-02: 10 mg via INTRAVENOUS

## 2019-12-02 MED ORDER — OXYCODONE HCL 5 MG PO TABS: 5.0000 mg | ORAL_TABLET | Freq: Four times a day (QID) | ORAL | 0 refills | Status: DC | PRN

## 2019-12-02 MED ORDER — ROCURONIUM BROMIDE 100 MG/10ML IV SOLN
INTRAVENOUS | Status: DC | PRN
  Administered 2019-12-02: 10 mg via INTRAVENOUS
  Administered 2019-12-02: 50 mg via INTRAVENOUS

## 2019-12-02 MED ORDER — ACETAMINOPHEN 500 MG PO TABS
ORAL_TABLET | ORAL | Status: AC
  Administered 2019-12-02: 1000 mg via ORAL
  Filled 2019-12-02: qty 2

## 2019-12-02 MED ORDER — LIDOCAINE-EPINEPHRINE 1 %-1:100000 IJ SOLN
INTRAMUSCULAR | Status: AC
  Filled 2019-12-02: qty 1

## 2019-12-02 MED ORDER — EPHEDRINE SULFATE 50 MG/ML IJ SOLN
INTRAMUSCULAR | Status: AC
  Filled 2019-12-02: qty 1

## 2019-12-02 MED ORDER — SUGAMMADEX SODIUM 500 MG/5ML IV SOLN
INTRAVENOUS | Status: DC | PRN
  Administered 2019-12-02: 250 mg via INTRAVENOUS

## 2019-12-02 MED ORDER — CELECOXIB 200 MG PO CAPS
ORAL_CAPSULE | ORAL | Status: AC
  Administered 2019-12-02: 09:00:00 200 mg via ORAL
  Filled 2019-12-02: qty 1

## 2019-12-02 MED ORDER — LIDOCAINE-EPINEPHRINE (PF) 1 %-1:200000 IJ SOLN
INTRAMUSCULAR | Status: DC | PRN
  Administered 2019-12-02: 20 mL via SUBCUTANEOUS

## 2019-12-02 MED ORDER — KETOROLAC TROMETHAMINE 30 MG/ML IJ SOLN
INTRAMUSCULAR | Status: DC | PRN
  Administered 2019-12-02: 30 mg via INTRAVENOUS

## 2019-12-02 MED ORDER — OXYCODONE HCL 5 MG PO TABS
5.0000 mg | ORAL_TABLET | Freq: Once | ORAL | Status: AC
  Filled 2019-12-02: qty 1

## 2019-12-02 MED ORDER — MIDAZOLAM HCL 2 MG/2ML IJ SOLN
INTRAMUSCULAR | Status: AC
  Filled 2019-12-02: qty 2

## 2019-12-02 MED ORDER — PROPOFOL 10 MG/ML IV BOLUS
INTRAVENOUS | Status: DC | PRN
  Administered 2019-12-02: 200 mg via INTRAVENOUS

## 2019-12-02 MED ORDER — SODIUM CHLORIDE 0.9 % IV SOLN
2.0000 g | INTRAVENOUS | Status: AC
  Administered 2019-12-02: 11:00:00 2 g via INTRAVENOUS
  Filled 2019-12-02: qty 2

## 2019-12-02 MED ORDER — FENTANYL CITRATE (PF) 100 MCG/2ML IJ SOLN
25.0000 ug | INTRAMUSCULAR | Status: DC | PRN
  Administered 2019-12-02 (×3): 25 ug via INTRAVENOUS

## 2019-12-02 SURGICAL SUPPLY — 53 items
APL SWBSTK 6 STRL LF DISP (MISCELLANEOUS)
APPLICATOR COTTON TIP 6 STRL (MISCELLANEOUS) IMPLANT
APPLICATOR COTTON TIP 6IN STRL (MISCELLANEOUS)
APPLIER CLIP 5 13 M/L LIGAMAX5 (MISCELLANEOUS)
APR CLP MED LRG 5 ANG JAW (MISCELLANEOUS)
BLADE SURG SZ11 CARB STEEL (BLADE) ×2 IMPLANT
CANISTER SUCT 1200ML W/VALVE (MISCELLANEOUS) ×2 IMPLANT
CHLORAPREP W/TINT 26 (MISCELLANEOUS) ×2 IMPLANT
CLIP APPLIE 5 13 M/L LIGAMAX5 (MISCELLANEOUS) IMPLANT
COVER WAND RF STERILE (DRAPES) ×2 IMPLANT
CUTTER FLEX LINEAR 45M (STAPLE) ×2 IMPLANT
DEFOGGER SCOPE WARMER CLEARIFY (MISCELLANEOUS) ×2 IMPLANT
DERMABOND ADVANCED (GAUZE/BANDAGES/DRESSINGS) ×1
DERMABOND ADVANCED .7 DNX12 (GAUZE/BANDAGES/DRESSINGS) ×1 IMPLANT
ELECT CAUTERY BLADE 6.4 (BLADE) ×2 IMPLANT
ELECT CAUTERY BLADE TIP 2.5 (TIP) ×2
ELECT REM PT RETURN 9FT ADLT (ELECTROSURGICAL) ×2
ELECTRODE CAUTERY BLDE TIP 2.5 (TIP) ×1 IMPLANT
ELECTRODE REM PT RTRN 9FT ADLT (ELECTROSURGICAL) ×1 IMPLANT
GLOVE BIO SURGEON STRL SZ 6.5 (GLOVE) ×8 IMPLANT
GLOVE INDICATOR 7.0 STRL GRN (GLOVE) ×10 IMPLANT
GOWN STRL REUS W/ TWL LRG LVL3 (GOWN DISPOSABLE) ×3 IMPLANT
GOWN STRL REUS W/TWL LRG LVL3 (GOWN DISPOSABLE) ×6
GRASPER SUT TROCAR 14GX15 (MISCELLANEOUS) IMPLANT
IRRIGATION STRYKERFLOW (MISCELLANEOUS) ×1 IMPLANT
IRRIGATOR STRYKERFLOW (MISCELLANEOUS) ×2
IV NS 1000ML (IV SOLUTION) ×2
IV NS 1000ML BAXH (IV SOLUTION) ×1 IMPLANT
KIT TURNOVER KIT A (KITS) ×2 IMPLANT
KITTNER LAPARASCOPIC 5X40 (MISCELLANEOUS) IMPLANT
LABEL OR SOLS (LABEL) ×2 IMPLANT
LIGASURE LAP MARYLAND 5MM 37CM (ELECTROSURGICAL) IMPLANT
NEEDLE HYPO 22GX1.5 SAFETY (NEEDLE) ×2 IMPLANT
NS IRRIG 500ML POUR BTL (IV SOLUTION) ×2 IMPLANT
PACK LAP CHOLECYSTECTOMY (MISCELLANEOUS) ×2 IMPLANT
PENCIL ELECTRO HAND CTR (MISCELLANEOUS) ×2 IMPLANT
POUCH SPECIMEN RETRIEVAL 10MM (ENDOMECHANICALS) ×2 IMPLANT
RELOAD STAPLE TA45 3.5 REG BLU (ENDOMECHANICALS) IMPLANT
SCISSORS METZENBAUM CVD 33 (INSTRUMENTS) ×2 IMPLANT
SET TUBE SMOKE EVAC HIGH FLOW (TUBING) ×2 IMPLANT
SHEARS HARMONIC ACE PLUS 36CM (ENDOMECHANICALS) ×2 IMPLANT
SLEEVE ADV FIXATION 5X100MM (TROCAR) ×4 IMPLANT
STRIP CLOSURE SKIN 1/2X4 (GAUZE/BANDAGES/DRESSINGS) ×2 IMPLANT
SUT MNCRL 4-0 (SUTURE) ×1
SUT MNCRL 4-0 27XMFL (SUTURE) ×1
SUT VIC AB 3-0 SH 27 (SUTURE) ×1
SUT VIC AB 3-0 SH 27X BRD (SUTURE) ×1 IMPLANT
SUT VICRYL 0 AB UR-6 (SUTURE) ×2 IMPLANT
SUTURE MNCRL 4-0 27XMF (SUTURE) ×1 IMPLANT
TRAY FOLEY MTR SLVR 16FR STAT (SET/KITS/TRAYS/PACK) ×2 IMPLANT
TROCAR 130MM GELPORT  DAV (MISCELLANEOUS) IMPLANT
TROCAR ADV FIXATION 12X100MM (TROCAR) IMPLANT
TROCAR Z-THREAD OPTICAL 5X100M (TROCAR) ×2 IMPLANT

## 2019-12-02 NOTE — Interval H&P Note (Signed)
History and Physical Interval Note:  12/02/2019 9:14 AM  Johnny Stein  has presented today for surgery, with the diagnosis of appendicitis perforated.  The various methods of treatment have been discussed with the patient and family. After consideration of risks, benefits and other options for treatment, the patient has consented to  Procedure(s): APPENDECTOMY LAPAROSCOPIC (N/A) as a surgical intervention.  The patient's history has been reviewed, patient examined, no change in status, stable for surgery.  I have reviewed the patient's chart and labs.  Questions were answered to the patient's satisfaction.     Duanne Guess

## 2019-12-02 NOTE — Discharge Instructions (Signed)
Laparoscopic Appendectomy, Adult, Care After This sheet gives you information about how to care for yourself after your procedure. Your health care provider may also give you more specific instructions. If you have problems or questions, contact your health care provider. What can I expect after the procedure? After the procedure, it is common to have:  Little energy for normal activities.  Mild pain in the area where the incisions were made.  Difficulty passing stool (constipation). This can be caused by: ? Pain medicine. ? A decrease in your activity. Follow these instructions at home: Medicines  Take over-the-counter and prescription medicines only as told by your health care provider.  If you were prescribed an antibiotic medicine, take it as told by your health care provider. Do not stop taking the antibiotic even if you start to feel better.  Do not drive or use heavy machinery while taking prescription pain medicine.  Ask your health care provider if the medicine prescribed to you can cause constipation. You may need to take steps to prevent or treat constipation, such as: ? Drink enough fluid to keep your urine pale yellow. ? Take over-the-counter or prescription medicines. ? Eat foods that are high in fiber, such as beans, whole grains, and fresh fruits and vegetables. ? Limit foods that are high in fat and processed sugars, such as fried or sweet foods. Incision care   Follow instructions from your health care provider about how to take care of your incisions. Make sure you: ? Wash your hands with soap and water before and after you change your bandage (dressing). If soap and water are not available, use hand sanitizer. ? Change your dressing as told by your health care provider. ? Leave stitches (sutures), skin glue, or adhesive strips in place. These skin closures may need to stay in place for 2 weeks or longer. If adhesive strip edges start to loosen and curl up, you may  trim the loose edges. Do not remove adhesive strips completely unless your health care provider tells you to do that.  Check your incision areas every day for signs of infection. Check for: ? Redness, swelling, or pain. ? Fluid or blood. ? Warmth. ? Pus or a bad smell. Bathing  Keep your incisions clean and dry. Clean them as often as told by your health care provider. To do this: 1. Gently wash the incisions with soap and water. 2. Rinse the incisions with water to remove all soap. 3. Pat the incisions dry with a clean towel. Do not rub the incisions.  Do not take baths, swim, or use a hot tub for 2 weeks, or until your health care provider approves. You may take showers after 48 hours. Activity   Do not drive for 24 hours if you were given a sedative during your procedure.  Rest after the procedure. Return to your normal activities as told by your health care provider. Ask your health care provider what activities are safe for you.  For 3 weeks, or for as long as told by your health care provider: ? Do not lift anything that is heavier than 10 lb (4.5 kg), or the limit that you are told. ? Do not play contact sports. General instructions  If you were sent home with a drain, follow instructions from your health care provider about how to care for it.  Take deep breaths. This helps to prevent your lungs from developing an infection (pneumonia).  Keep all follow-up visits as told by your health   care provider. This is important. Contact a health care provider if:  You have redness, swelling, or pain around an incision.  You have fluid or blood coming from an incision.  Your incision feels warm to the touch.  You have pus or a bad smell coming from an incision or dressing.  Your incision edges break open after your sutures have been removed.  You have increasing pain in your shoulders.  You feel dizzy or you faint.  You develop shortness of breath.  You keep feeling  nauseous or you are vomiting.  You have diarrhea or you cannot control your bowel functions.  You lose your appetite.  You develop swelling or pain in your legs.  You develop a rash. Get help right away if you have:  A fever.  Difficulty breathing.  Sharp pains in your chest. Summary  After a laparoscopic appendectomy, it is common to have little energy for normal activities, mild pain in the area of the incisions, and constipation.  Infection is the most common complication after this procedure. Follow your health care provider's instructions about caring for yourself after the procedure.  Rest after the procedure. Return to your normal activities as told by your health care provider.  Contact your health care provider if you notice signs of infection around your incisions or you develop shortness of breath. Get help right away if you have a fever, chest pain, or difficulty breathing. This information is not intended to replace advice given to you by your health care provider. Make sure you discuss any questions you have with your health care provider. Document Revised: 05/03/2018 Document Reviewed: 05/03/2018 Elsevier Patient Education  2020 [employer]  

## 2019-12-02 NOTE — Anesthesia Preprocedure Evaluation (Signed)
Anesthesia Evaluation  Patient identified by MRN, date of birth, ID band Patient awake    Reviewed: Allergy & Precautions, H&P , NPO status , Patient's Chart, lab work & pertinent test results, reviewed documented beta blocker date and time   Airway Mallampati: II  TM Distance: >3 FB Neck ROM: full    Dental  (+) Teeth Intact   Pulmonary neg pulmonary ROS, Current Smoker,    Pulmonary exam normal        Cardiovascular Exercise Tolerance: Good negative cardio ROS Normal cardiovascular exam Rhythm:regular Rate:Normal     Neuro/Psych negative neurological ROS  negative psych ROS   GI/Hepatic negative GI ROS, Neg liver ROS, GERD  Medicated,  Endo/Other  negative endocrine ROS  Renal/GU negative Renal ROS  negative genitourinary   Musculoskeletal   Abdominal   Peds  Hematology negative hematology ROS (+)   Anesthesia Other Findings Past Medical History: No date: GERD (gastroesophageal reflux disease) No date: IBS (irritable bowel syndrome) Past Surgical History: No date: FEMUR HARDWARE REMOVAL No date: FEMUR SURGERY; Bilateral No date: WISDOM TOOTH EXTRACTION   Reproductive/Obstetrics negative OB ROS                             Anesthesia Physical Anesthesia Plan  ASA: II  Anesthesia Plan: General ETT   Post-op Pain Management:    Induction:   PONV Risk Score and Plan:   Airway Management Planned:   Additional Equipment:   Intra-op Plan:   Post-operative Plan:   Informed Consent: I have reviewed the patients History and Physical, chart, labs and discussed the procedure including the risks, benefits and alternatives for the proposed anesthesia with the patient or authorized representative who has indicated his/her understanding and acceptance.     Dental Advisory Given  Plan Discussed with: CRNA  Anesthesia Plan Comments:         Anesthesia Quick Evaluation

## 2019-12-02 NOTE — Anesthesia Postprocedure Evaluation (Signed)
Anesthesia Post Note  Patient: KEILAN NICHOL  Procedure(s) Performed: APPENDECTOMY LAPAROSCOPIC (N/A )  Patient location during evaluation: PACU Anesthesia Type: General Level of consciousness: awake and alert Pain management: pain level controlled Vital Signs Assessment: post-procedure vital signs reviewed and stable Respiratory status: spontaneous breathing, nonlabored ventilation, respiratory function stable and patient connected to nasal cannula oxygen Cardiovascular status: blood pressure returned to baseline and stable Postop Assessment: no apparent nausea or vomiting Anesthetic complications: no     Last Vitals:  Vitals:   12/02/19 1253 12/02/19 1308  BP: 134/81 129/77  Pulse: 65 (!) 59  Resp: 14 (!) 22  Temp:    SpO2: 95% 96%    Last Pain:  Vitals:   12/02/19 1308  TempSrc:   PainSc: 5                  Yevette Edwards

## 2019-12-02 NOTE — Op Note (Signed)
Operative Note  Laparoscopic Appendectomy   Dickey Gave Date of operation:  12/02/2019  Indications: The patient presented with a history of  abdominal pain. Workup has revealed findings consistent with acute appendicitis.  At the time of his initial evaluation, the appendix was perforated and there was significant phlegmon present.  He was placed on antibiotics and is brought to the operating room today for an interval appendectomy.  Pre-operative Diagnosis: Acute appendicitis with generalized peritonitis  Post-operative Diagnosis: Same  Surgeon: Duanne Guess, MD  Anesthesia: GETA  Findings: There were adhesions in the right lower quadrant consistent with a history of prior perforation.  Only the tip of the appendix was still somewhat inflamed; the base appeared essentially normal.  There was no frank pus or purulent fluid present.  Estimated Blood Loss: Less than 5 cc         Specimens: appendix         Complications: None immediately apparent  Procedure Details  The patient was seen again in the preop area. The options of surgery versus observation were reviewed with the patient and/or family. The risks of bleeding, infection, recurrence of symptoms, negative laparoscopy, potential for an open procedure, bowel injury, abscess or infection, were all reviewed as well. The patient was taken to Operating Room, identified as Johnny Stein and the procedure verified as laparoscopic appendectomy. A time out was performed and the above information confirmed.  The patient was placed in the supine position and general anesthesia was induced.  Antibiotic prophylaxis was administered and VTE prophylaxis was in place. A Foley catheter was placed by the nursing staff.   The abdomen was prepped and draped in a sterile fashion.  Due to the patient's adiposity, I elected to enter the abdomen via Optiview technique in the right upper quadrant.  This was achieved via a 5 mm trocar.  Pneumoperitoneum obtained.  A 10 mm supraumbilical port and two 5 mm working ports were placed under direct visualization.  The camera was switched to the 10 mm port.  The appendix was identified and found to be retrocecal and encased in adhesions from the prior perforation.  The adhesions were lysed using a combination of harmonic scalpel and blunt dissection. The mesoappendix was divided with the Harmonic scalpel. The base of the appendix was dissected out and divided with a standard load Endo GIA.The appendix was placed in a Endo pouch bag and removed via the 10 mm port. The right lower quadrant and pelvis was then irrigated with normal saline which was then aspirated. The right lower quadrant was inspected and there was no sign of bleeding or bowel injury therefore pneumoperitoneum was released, all ports were removed.  The umbilical fascia was evaluated and the opening was actually less than 5 mm, therefore I elected not to close it.  The deep dermis was closed with 3-0 Vicryl interrupted sutures and the skin incisions were approximated with subcuticular 4-0 Monocryl. Dermabond was applied and followed by Steri-Strips.  The patient tolerated the procedure well and there were no immediately apparent complications. The sponge lap and needle count were correct at the end of the procedure.  The patient was taken to the recovery room in stable condition to be admitted for continued care.   Duanne Guess, MD, FACS

## 2019-12-02 NOTE — Anesthesia Procedure Notes (Signed)
Procedure Name: Intubation Date/Time: 12/02/2019 10:39 AM Performed by: Henrietta Hoover, CRNA Pre-anesthesia Checklist: Patient identified, Patient being monitored, Timeout performed, Emergency Drugs available and Suction available Patient Re-evaluated:Patient Re-evaluated prior to induction Oxygen Delivery Method: Circle system utilized Preoxygenation: Pre-oxygenation with 100% oxygen Induction Type: IV induction Ventilation: Mask ventilation without difficulty Laryngoscope Size: McGraph and 4 Grade View: Grade I Tube type: Oral Tube size: 7.0 mm Number of attempts: 1 Airway Equipment and Method: Stylet Placement Confirmation: ETT inserted through vocal cords under direct vision,  positive ETCO2 and breath sounds checked- equal and bilateral Secured at: 24 cm Tube secured with: Tape Dental Injury: Teeth and Oropharynx as per pre-operative assessment

## 2019-12-02 NOTE — Transfer of Care (Signed)
Immediate Anesthesia Transfer of Care Note  Patient: Johnny Stein  Procedure(s) Performed: APPENDECTOMY LAPAROSCOPIC (N/A )  Patient Location: PACU  Anesthesia Type:General  Level of Consciousness: awake, alert  and oriented  Airway & Oxygen Therapy: Patient Spontanous Breathing and Patient connected to face mask oxygen  Post-op Assessment: Report given to RN and Post -op Vital signs reviewed and stable  Post vital signs: Reviewed and stable  Last Vitals:  Vitals Value Taken Time  BP 139/86 12/02/19 1205  Temp 36.5 C 12/02/19 1205  Pulse 55 12/02/19 1220  Resp 17 12/02/19 1220  SpO2 100 % 12/02/19 1220  Vitals shown include unvalidated device data.  Last Pain:  Vitals:   12/02/19 1205  TempSrc:   PainSc: Asleep         Complications: No apparent anesthesia complications

## 2019-12-05 LAB — SURGICAL PATHOLOGY

## 2020-04-02 ENCOUNTER — Ambulatory Visit: Payer: Self-pay | Attending: Internal Medicine

## 2020-04-02 DIAGNOSIS — Z23 Encounter for immunization: Secondary | ICD-10-CM

## 2020-04-02 NOTE — Progress Notes (Signed)
   Covid-19 Vaccination Clinic  Name:  Johnny Stein    MRN: [id number] DOB: 1996-09-23  04/02/2020  Mr. Escher was observed post Covid-19 immunization for 15 minutes without incident. He was provided with Vaccine Information Sheet and instruction to access the V-Safe system.   Mr. Noy was instructed to call 911 with any severe reactions post vaccine: Marland Kitchen Difficulty breathing  . Swelling of face and throat  . A fast heartbeat  . A bad rash all over body  . Dizziness and weakness   Immunizations Administered    Name Date Dose VIS Date Route   Pfizer COVID-19 Vaccine 04/02/2020 10:56 AM 0.3 mL 01/08/2019 Intramuscular   Manufacturer: [employer], [employer]   Lot: [id number]   NDC: [address]-6

## 2020-04-27 ENCOUNTER — Ambulatory Visit: Payer: Self-pay | Attending: Internal Medicine

## 2020-04-27 DIAGNOSIS — Z23 Encounter for immunization: Secondary | ICD-10-CM

## 2020-04-27 NOTE — Progress Notes (Signed)
   Covid-19 Vaccination Clinic  Name:  Johnny Stein    MRN: [id number] DOB: [birth year]  04/27/2020  [name] was observed post Covid-19 immunization for 15 minutes without incident. He was provided with Vaccine Information Sheet and instruction to access the V-Safe system.   [name] was instructed to call 911 with any severe reactions post vaccine: Marland Kitchen Difficulty breathing  . Swelling of face and throat  . A fast heartbeat  . A bad rash all over body  . Dizziness and weakness   Immunizations Administered    Name Date Dose VIS Date Route   Pfizer COVID-19 Vaccine 04/27/2020  2:08 PM 0.3 mL 01/08/2019 Intramuscular   Manufacturer: [employer], [employer]   Lot: [id number]   NDC: [address]-1

## 2021-03-17 ENCOUNTER — Ambulatory Visit
Admission: EM | Admit: 2021-03-17 | Discharge: 2021-03-17 | Disposition: A | Discharge status: Home / Self Care | Payer: BC Managed Care – PPO | Attending: Family Medicine | Admitting: Family Medicine

## 2021-03-17 ENCOUNTER — Other Ambulatory Visit: Payer: Self-pay

## 2021-03-17 DIAGNOSIS — R002 Palpitations: Secondary | ICD-10-CM | POA: Diagnosis not present

## 2021-03-17 NOTE — ED Provider Notes (Signed)
[facility]    CSN: [id number] Arrival date & time: 03/17/21  1703  History   Chief Complaint Chief Complaint  Patient presents with  . Chest Pain   HPI  25 year old male presents with chest discomfort and palpitations.  Has been going on for the past 3 to 4 days.  He reports intermittent left-sided chest discomfort which he describes as achy.  He reports that he has had palpitations as well.  BP elevated at home.  No shortness of breath.  Pain currently 2/10 in severity.  No relieving factors.  Patient states that he does drink quite a bit of caffeine daily.  No other reported symptoms.  No other complaints.  Past Medical History:  Diagnosis Date  . GERD (gastroesophageal reflux disease)   . IBS (irritable bowel syndrome)     Patient Active Problem List   Diagnosis Date Noted  . Acute appendicitis 11/13/2019  . Acute perforated appendicitis 10/08/2019  . Ingrown toenail 02/11/2016  . Abdominal pain 10/29/2015    Past Surgical History:  Procedure Laterality Date  . FEMUR HARDWARE REMOVAL    . FEMUR SURGERY Bilateral   . LAPAROSCOPIC APPENDECTOMY N/A 12/02/2019   Procedure: APPENDECTOMY LAPAROSCOPIC;  Surgeon: Duanne Guess, MD;  Location: [facility] ORS;  Service: General;  Laterality: N/A;  . WISDOM TOOTH EXTRACTION         Home Medications    Prior to Admission medications   Medication Sig Start Date End Date Taking? Authorizing Provider  Multiple Vitamin (MULTIVITAMIN WITH MINERALS) TABS tablet Take 1 tablet by mouth daily.   Yes [provider]  ibuprofen (ADVIL) 800 MG tablet Take 1 tablet (800 mg total) by mouth every 8 (eight) hours as needed. 12/02/19   Duanne Guess, MD  oxyCODONE (OXY IR/ROXICODONE) 5 MG immediate release tablet Take 1 tablet (5 mg total) by mouth every 6 (six) hours as needed for severe pain. 12/02/19   Duanne Guess, MD    Family History Family History  Problem Relation Age of Onset  . Hypertension Mother   .  Diabetes Father     Social History Social History   Tobacco Use  . Smoking status: Light Tobacco Smoker    Types: Cigarettes  . Smokeless tobacco: Never Used  Vaping Use  . Vaping Use: Every day  . Substances: Nicotine, Flavoring, Nicotine-salt  Substance Use Topics  . Alcohol use: Yes    Alcohol/week: 1.0 standard drink    Types: 1 Shots of liquor per week  . Drug use: Not Currently    Types: Marijuana     Allergies   Patient has no known allergies.   Review of Systems Review of Systems  Constitutional: Negative.   Cardiovascular: Positive for chest pain and palpitations.   Physical Exam Triage Vital Signs ED Triage Vitals  Enc Vitals Group     BP 03/17/21 1718 (!) 147/100     Pulse Rate 03/17/21 1718 95     Resp 03/17/21 1718 20     Temp 03/17/21 1718 98.5 F (36.9 C)     Temp src --      SpO2 03/17/21 1718 99 %     Weight --      Height --      Head Circumference --      Peak Flow --      Pain Score 03/17/21 1720 2     Pain Loc --      Pain Edu? --      Excl. in GC? --  Updated Vital Signs BP (!) 147/100   Pulse 95   Temp 98.5 F (36.9 C)   Resp 20   SpO2 99%   Visual Acuity Right Eye Distance:   Left Eye Distance:   Bilateral Distance:    Right Eye Near:   Left Eye Near:    Bilateral Near:     Physical Exam Vitals and nursing note reviewed.  Constitutional:      General: He is not in acute distress.    Appearance: Normal appearance. He is obese. He is not ill-appearing.  Eyes:     General:        Right eye: No discharge.        Left eye: No discharge.     Conjunctiva/sclera: Conjunctivae normal.  Cardiovascular:     Rate and Rhythm: Normal rate and regular rhythm.     Heart sounds: No murmur heard.   Pulmonary:     Effort: Pulmonary effort is normal.     Breath sounds: Normal breath sounds. No wheezing, rhonchi or rales.  Neurological:     Mental Status: He is alert.  Psychiatric:        Mood and Affect: Mood normal.         Behavior: Behavior normal.    UC Treatments / Results  Labs (all labs ordered are listed, but only abnormal results are displayed) Labs Reviewed - No data to display  EKG Interpretation: Normal sinus rhythm at a rate of 78.  Normal axis.  Normal intervals.  Normal EKG.  Radiology No results found.  Procedures Procedures (including critical care time)  Medications Ordered in UC Medications - No data to display  Initial Impression / Assessment and Plan / UC Course  I have reviewed the triage vital signs and the nursing notes.  Pertinent labs & imaging results that were available during my care of the patient were reviewed by me and considered in my medical decision making (see chart for details).    25 year old male presents with intermittent chest pain and palpitations.  EKG unremarkable.  Well-appearing on exam.  Discussed watchful waiting versus referral to cardiology for Holter.  Patient elected for the latter.  Referral placed.  Final Clinical Impressions(s) / UC Diagnoses   Final diagnoses:  Palpitations   Discharge Instructions   None    ED Prescriptions    None     PDMP not reviewed this encounter.   Tommie Sams, [place] 03/17/21 1801

## 2021-03-17 NOTE — ED Triage Notes (Signed)
Pt presents with complaints of chest tightness on the left center chest x 4 days. Reports it started while he was laying down. Pt describes pain as a tightness that has turned into a dull constant pain. Pt endorses palpitations that come and go. Reports his blood pressure was elevated at home, and the home machine told him there was an irregular heart beat detected. Reports diaphoresis one time over the last 4 days.

## 2021-07-01 ENCOUNTER — Ambulatory Visit: Payer: BLUE CROSS/BLUE SHIELD | Admitting: Family Medicine

## 2021-07-12 IMAGING — CT CT ABD-PELV W/ CM
2 of 4 series · 16 of 46 positions shown, 18 images · IV contrast (APPLIED)
Comparison: Right upper quadrant ultrasound and CT 10/07/2019

CLINICAL DATA: Nausea, vomiting, diarrhea. Right-sided abdominal
pain. History of perforated appendicitis last month, scheduled for
laparoscopic appendectomy 11/28/2019.

EXAM:
CT ABDOMEN AND PELVIS WITH CONTRAST
TECHNIQUE: Multidetector CT imaging of the abdomen and pelvis was performed
using the standard protocol following bolus administration of
intravenous contrast.
CONTRAST:  100mL OMNIPAQUE IOHEXOL 300 MG/ML  SOLN

[Series 2: axial st · axial · 0.97mm/px · z∈[-985,-500]mm · 13 of 107 slices shown, 15 images]
[im 5/107  soft-tissue]
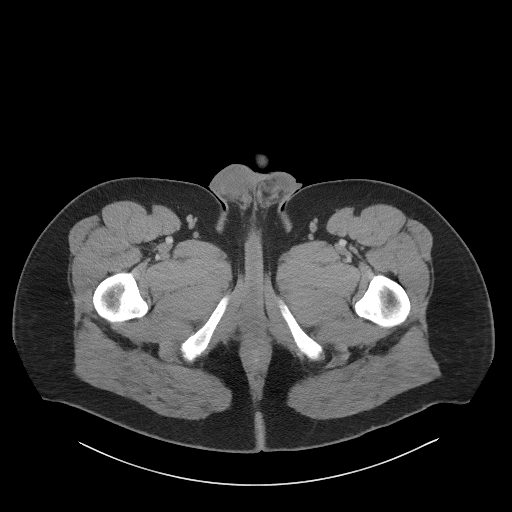
[im 5/107  bone]
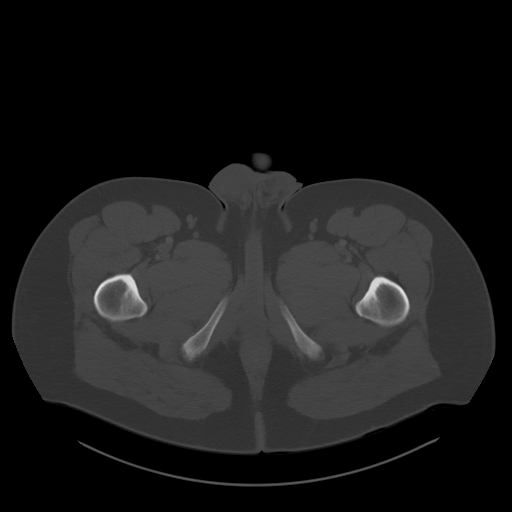
[im 14/107  soft-tissue]
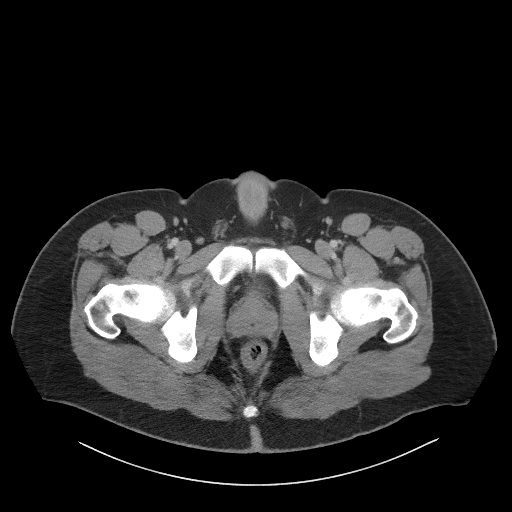
[im 24/107  soft-tissue]
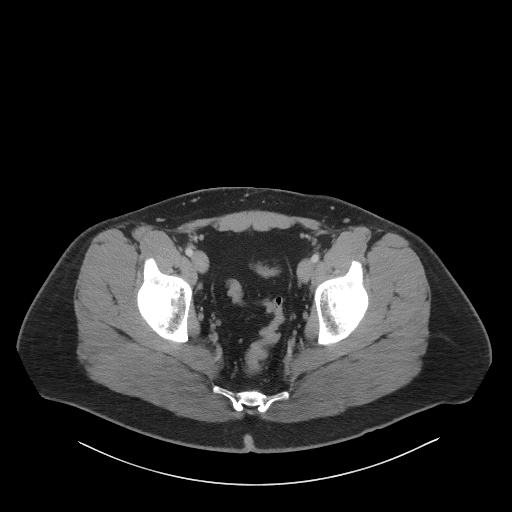
[im 28/107  soft-tissue]
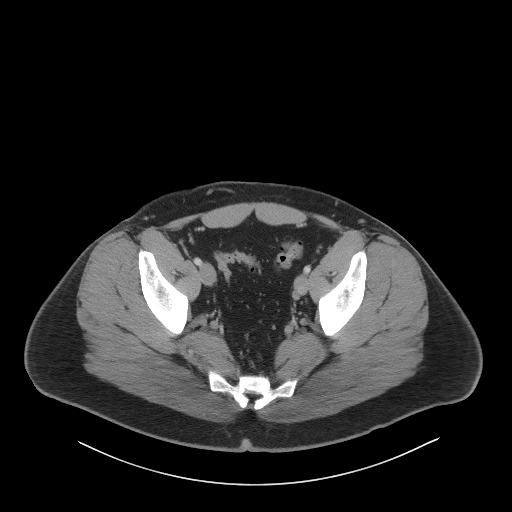
[im 37/107  soft-tissue]
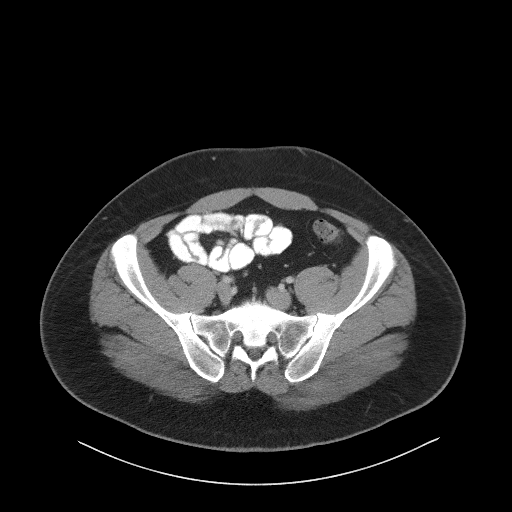
[im 47/107  soft-tissue]
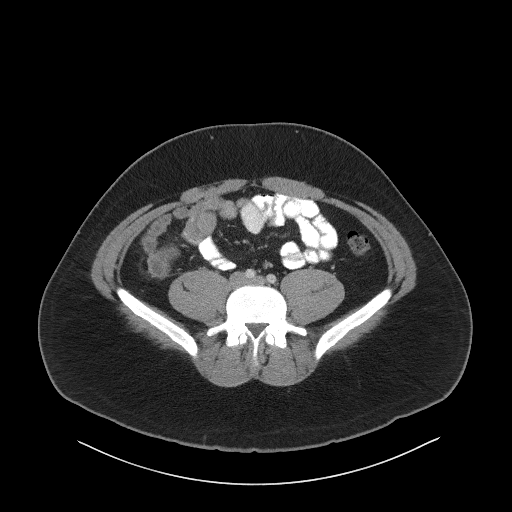
[im 56/107  soft-tissue]
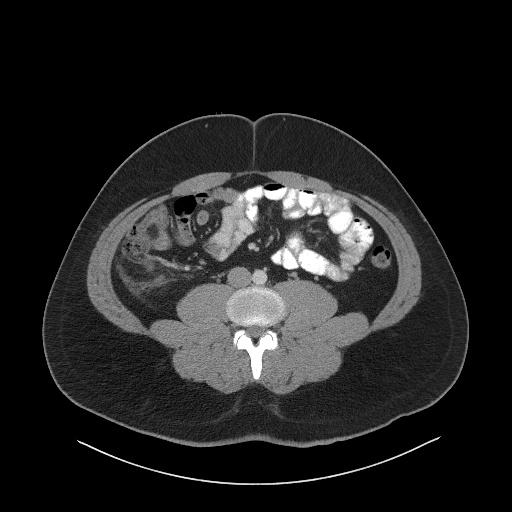
[im 60/107  soft-tissue]
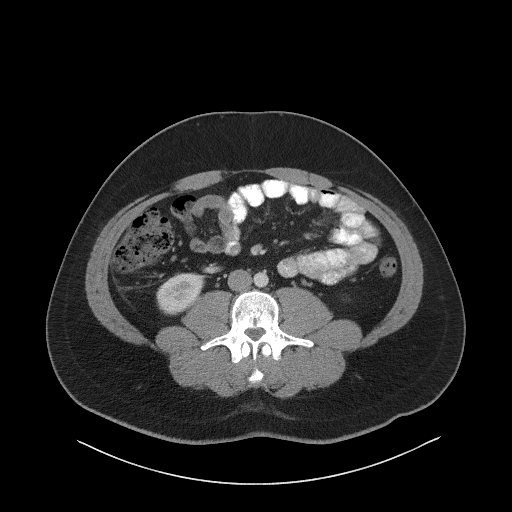
[im 70/107  soft-tissue]
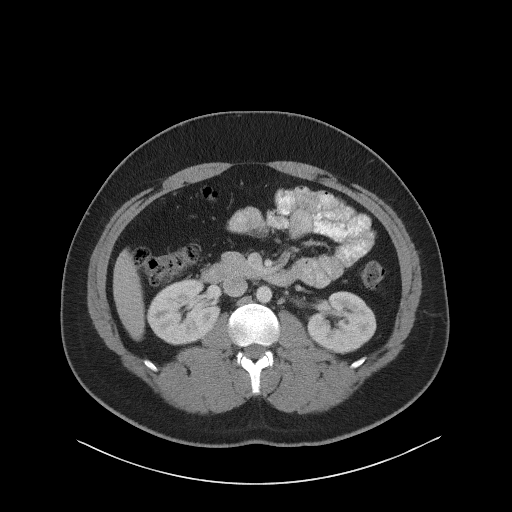
[im 70/107  bone]
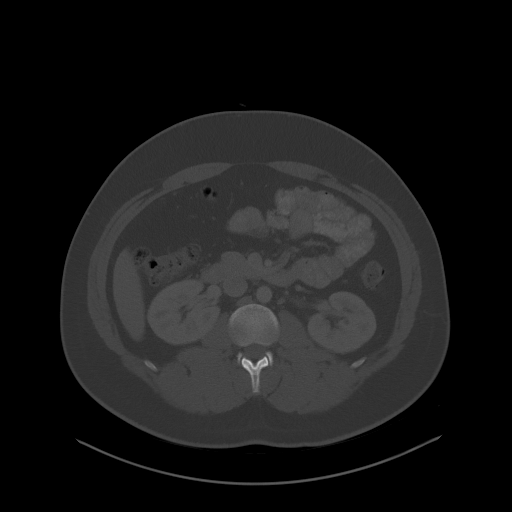
[im 79/107  soft-tissue]
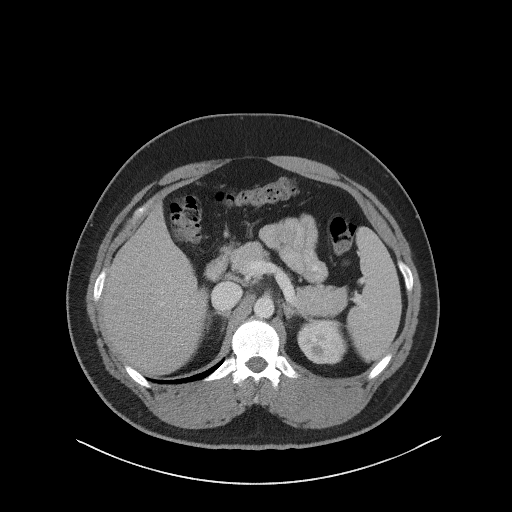
[im 83/107  soft-tissue]
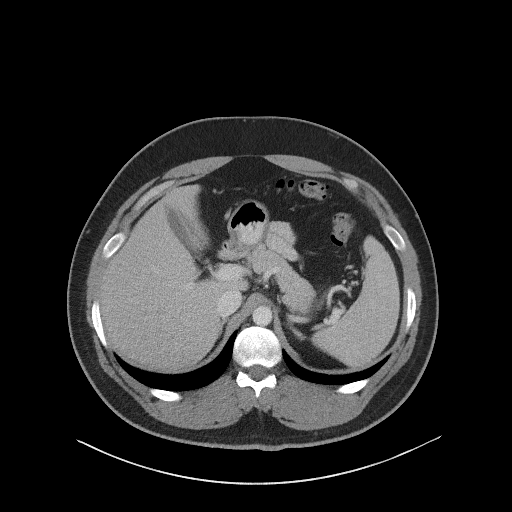
[im 93/107  soft-tissue]
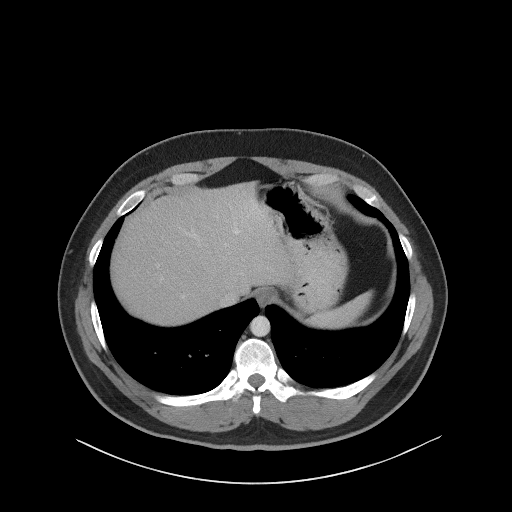
[im 102/107  soft-tissue]
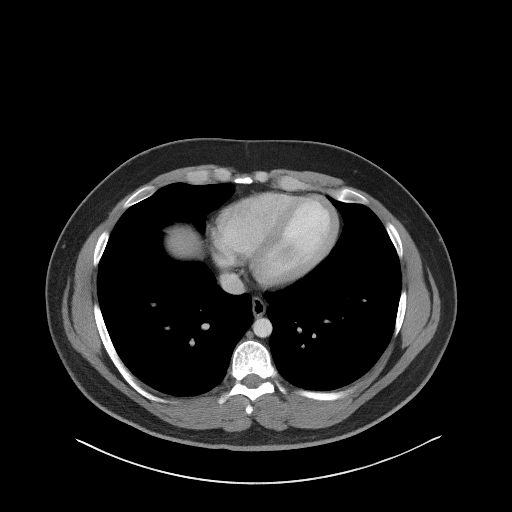

[Series 5: coronal st · coronal · 0.92mm/px · 3 of 107 slices shown]
[im 36/107  soft-tissue]
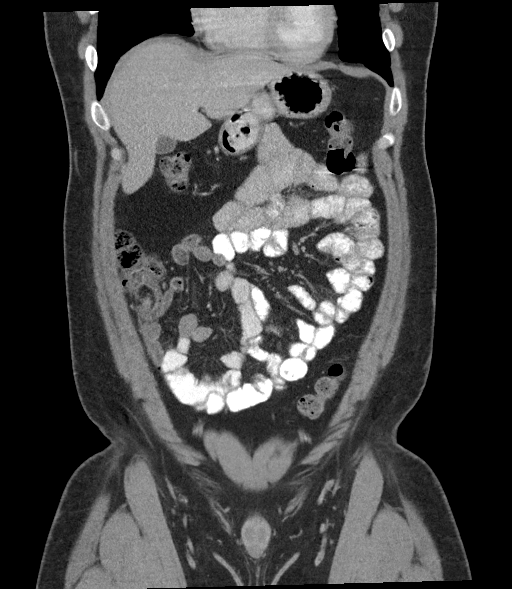
[im 48/107  soft-tissue]
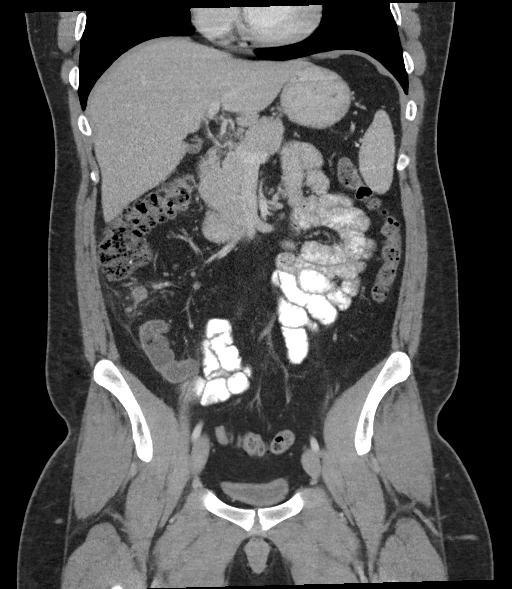
[im 59/107  soft-tissue]
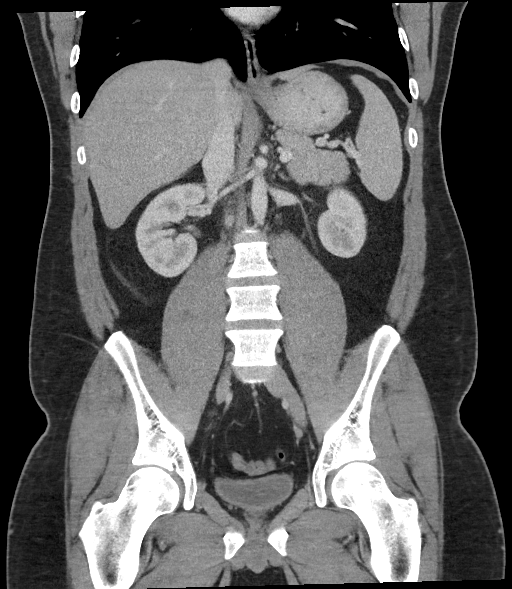

[16 of 46 positions shown; findings below may reference images not displayed]

FINDINGS: Lower chest: Lung bases are clear.

Hepatobiliary: No focal liver abnormality is seen. No gallstones,
gallbladder wall thickening, or biliary dilatation.

Pancreas: No ductal dilatation or inflammation.

Spleen: Normal in size without focal abnormality.

Adrenals/Urinary Tract: Normal adrenal glands. No hydronephrosis or
perinephric edema. Homogeneous renal enhancement. Urinary bladder is
physiologically distended without wall thickening.

Stomach/Bowel: Dilated appendix measuring 10 mm. Persistent but
improved periappendiceal fat stranding and free fluid in the right
pericolic gutter. Foci of adjacent free air have resolved. There is
no organized fluid collection or focal abscess. No appendicolith. No
associated cecal air colonic wall thickening. Small bowel is normal.
Stomach physiologically distended. Small to moderate colonic stool
burden.

Vascular/Lymphatic: Persistent but improving ileocolic adenopathy,
largest node measures 6 mm currently. No new adenopathy. Portal vein
and mesenteric vessels are patent. Normal caliber abdominal aorta.

Reproductive: Prostate is unremarkable.

Other: No intra-abdominal abscess. Persistent but improved
inflammatory change in free fluid in the right lower
quadrant/pericolic gutter. Free air has resolved since prior exam.
Tiny fat containing umbilical hernia.

Musculoskeletal: There are no acute or suspicious osseous
abnormalities.
IMPRESSION: 1. No evidence of periappendiceal or intra-abdominal abscess after
recent perforated appendicitis. Appendix remains dilated with
persistent or recurrent surrounding inflammation and small volume
free fluid in the right lower quadrant/pericolic gutter. Free fluid
has improved from previous. Previous free air has resolved.
2. Persistent but improving ileocolic adenopathy, likely reactive.

## 2021-08-03 ENCOUNTER — Encounter: Payer: Self-pay | Admitting: General Surgery

## 2021-08-12 ENCOUNTER — Ambulatory Visit: Payer: BLUE CROSS/BLUE SHIELD | Admitting: Family Medicine

## 2022-01-31 ENCOUNTER — Other Ambulatory Visit: Payer: Self-pay

## 2022-01-31 ENCOUNTER — Emergency Department: Payer: BC Managed Care – PPO

## 2022-01-31 ENCOUNTER — Emergency Department
Admission: EM | Admit: 2022-01-31 | Discharge: 2022-01-31 | Disposition: A | Discharge status: Home / Self Care | Payer: BC Managed Care – PPO | Attending: Emergency Medicine | Admitting: Emergency Medicine

## 2022-01-31 DIAGNOSIS — R0789 Other chest pain: Secondary | ICD-10-CM | POA: Insufficient documentation

## 2022-01-31 DIAGNOSIS — R002 Palpitations: Secondary | ICD-10-CM | POA: Diagnosis not present

## 2022-01-31 DIAGNOSIS — R200 Anesthesia of skin: Secondary | ICD-10-CM | POA: Diagnosis not present

## 2022-01-31 DIAGNOSIS — R079 Chest pain, unspecified: Secondary | ICD-10-CM

## 2022-01-31 DIAGNOSIS — F419 Anxiety disorder, unspecified: Secondary | ICD-10-CM | POA: Diagnosis not present

## 2022-01-31 LAB — BASIC METABOLIC PANEL
Anion gap: 8 (ref 5–15)
BUN: 16 mg/dL (ref 6–20)
CO2: 26 mmol/L (ref 22–32)
Calcium: 8.9 mg/dL (ref 8.9–10.3)
Chloride: 105 mmol/L (ref 98–111)
Creatinine, Ser: 0.92 mg/dL (ref 0.61–1.24)
GFR, Estimated: >60 mL/min (ref >60)
Glucose, Bld: 112 mg/dL — ABNORMAL HIGH (ref 70–99)
Potassium: 3.8 mmol/L (ref 3.5–5.1)
Sodium: 139 mmol/L (ref 135–145)

## 2022-01-31 LAB — TROPONIN I (HIGH SENSITIVITY)
Troponin I (High Sensitivity): 4 ng/L (ref <18)
Troponin I (High Sensitivity): 3 ng/L (ref <18)

## 2022-01-31 LAB — T4, FREE: Free T4: 1.23 ng/dL — ABNORMAL HIGH (ref 0.61–1.12)

## 2022-01-31 LAB — CBC
HCT: 45.9 % (ref 39.0–52.0)
Hemoglobin: 15.8 g/dL (ref 13.0–17.0)
MCH: 29.8 pg (ref 26.0–34.0)
MCHC: 34.4 g/dL (ref 30.0–36.0)
MCV: 86.4 fL (ref 80.0–100.0)
Platelets: 214 10*3/uL (ref 150–400)
RBC: 5.31 MIL/uL (ref 4.22–5.81)
RDW: 12 % (ref 11.5–15.5)
WBC: 5.9 10*3/uL (ref 4.0–10.5)
nRBC: 0 % (ref 0.0–0.2)

## 2022-01-31 LAB — TSH: TSH: 1.992 u[IU]/mL (ref 0.350–4.500)

## 2022-01-31 LAB — D-DIMER, QUANTITATIVE: D-Dimer, Quant: <0.27 ug/mL-FEU (ref 0.00–0.50)

## 2022-01-31 MED ORDER — LORAZEPAM 2 MG/ML IJ SOLN
1.0000 mg | Freq: Once | INTRAMUSCULAR | Status: AC
  Administered 2022-01-31: 1 mg via INTRAVENOUS
  Filled 2022-01-31: qty 1

## 2022-01-31 MED ORDER — KETOROLAC TROMETHAMINE 30 MG/ML IJ SOLN
10.0000 mg | Freq: Once | INTRAMUSCULAR | Status: AC
  Administered 2022-01-31: 9.9 mg via INTRAVENOUS
  Filled 2022-01-31: qty 1

## 2022-01-31 NOTE — Discharge Instructions (Signed)
Continue to hold vaping and energy drinks.  If you can, reduce your caffeine intake daily.  Return to the ER for worsening symptoms, persistent vomiting, difficulty breathing or other concerns. ?

## 2022-01-31 NOTE — ED Provider Notes (Signed)
? ?[facility] ?Provider Note ? ? ? Event Date/Time  ? First MD Initiated Contact with Patient 01/31/22 [phone]   ?  (approximate) ? ? ?History  ? ?Chest Pain ? ? ?HPI ? ?Johnny Stein is a 26 y.o. male who presents to the ED from home with a chief complaint of chest pain.  Patient reports left-sided chest pain waxing/waning x3 days.  He is a left side sleeper and awoke this morning with left arm numbness/pain.  Endorses palpitations and feelings of anxiety.  Similar symptoms last May for which she was seen at urgent care and referred to cardiology but patient did not follow-up.  States he stopped vaping 3 days ago and stopped drinking energy drinks around that time as well.  Denies fever, cough, shortness of breath, abdominal pain, nausea, vomiting or dizziness.  Denies hormone use but did recently drive to [place]. ?  ? ? ?Past Medical History  ? ?Past Medical History:  ?Diagnosis Date  ? GERD (gastroesophageal reflux disease)   ? IBS (irritable bowel syndrome)   ? ? ? ?Active Problem List  ? ?Patient Active Problem List  ? Diagnosis Date Noted  ? Acute appendicitis 11/13/2019  ? Acute perforated appendicitis 10/08/2019  ? Ingrown toenail 02/11/2016  ? Abdominal pain 10/29/2015  ? ? ? ?Past Surgical History  ? ?Past Surgical History:  ?Procedure Laterality Date  ? FEMUR HARDWARE REMOVAL    ? FEMUR SURGERY Bilateral   ? LAPAROSCOPIC APPENDECTOMY N/A 12/02/2019  ? Procedure: APPENDECTOMY LAPAROSCOPIC;  Surgeon: Duanne Guess, MD;  Location: [facility] ORS;  Service: General;  Laterality: N/A;  ? WISDOM TOOTH EXTRACTION    ? ? ? ?Home Medications  ? ?Prior to Admission medications   ?Medication Sig Start Date End Date Taking? Authorizing Provider  ?ibuprofen (ADVIL) 800 MG tablet Take 1 tablet (800 mg total) by mouth every 8 (eight) hours as needed. 12/02/19   Duanne Guess, MD  ?Multiple Vitamin (MULTIVITAMIN WITH MINERALS) TABS tablet Take 1 tablet by mouth daily.    [provider]   ?oxyCODONE (OXY IR/ROXICODONE) 5 MG immediate release tablet Take 1 tablet (5 mg total) by mouth every 6 (six) hours as needed for severe pain. 12/02/19   Duanne Guess, MD  ? ? ? ?Allergies  ?Patient has no known allergies. ? ? ?Family History  ? ?Family History  ?Problem Relation Age of Onset  ? Hypertension Mother   ? Diabetes Father   ? ? ? ?Physical Exam  ?Triage Vital Signs: ?ED Triage Vitals [01/31/22 0327]  ?Enc Vitals Group  ?   BP 138/86  ?   Pulse Rate 77  ?   Resp 16  ?   Temp   ?   Temp src   ?   SpO2 97 %  ?   Weight 257 lb (116.6 kg)  ?   Height 6' (1.829 m)  ?   Head Circumference   ?   Peak Flow   ?   Pain Score 3  ?   Pain Loc   ?   Pain Edu?   ?   Excl. in GC?   ? ? ?Updated Vital Signs: ?BP 138/86   Pulse 77   Resp 16   Ht 6' (1.829 m)   Wt 116.6 kg   SpO2 97%   BMI 34.86 kg/m?  ? ? ?General: Awake, no distress.  Mildly anxious. ?CV:  RRR.  Good peripheral perfusion.  ?Resp:  Normal effort.  CTA B. ?  Abd:  Nontender.  No distention.  ?Other:  No thyromegaly.  Left shoulder nontender with full range of motion without pain.  No left arm swelling.  2+ radial pulses.  Brisk, less than 5-second cap refill. ? ? ?ED Results / Procedures / Treatments  ?Labs ?(all labs ordered are listed, but only abnormal results are displayed) ?Labs Reviewed  ?BASIC METABOLIC PANEL - Abnormal; Notable for the following components:  ?    Result Value  ? Glucose, Bld 112 (*)   ? All other components within normal limits  ?T4, FREE - Abnormal; Notable for the following components:  ? Free T4 1.23 (*)   ? All other components within normal limits  ?CBC  ?TSH  ?D-DIMER, QUANTITATIVE  ?TROPONIN I (HIGH SENSITIVITY)  ?TROPONIN I (HIGH SENSITIVITY)  ? ? ? ?EKG ? ?ED ECG REPORT ?I, Irean HongSUNG,Satcha Storlie J, the attending physician, personally viewed and interpreted this ECG. ? ? Date: 01/31/2022 ? EKG Time: 0325 ? Rate: 66 ? Rhythm: normal sinus rhythm ? Axis: Normal ? Intervals:none ? ST&T Change: Nonspecific ? ? ? ?RADIOLOGY ?I  have independently visualized and reviewed patient's chest x-ray as well as noted the radiology interpretation: ? ?Chest x-ray: No acute cardiopulmonary process ? ?Official radiology report(s): ?DG Chest 2 View ? ?Result Date: 01/31/2022 ?CLINICAL DATA:  Chest pain EXAM: CHEST - 2 VIEW COMPARISON:  None. FINDINGS: The heart size and mediastinal contours are within normal limits. Both lungs are clear. The visualized skeletal structures are unremarkable. IMPRESSION: No active cardiopulmonary disease. Electronically Signed   By: Helyn NumbersAshesh  Parikh M.D.   On: 01/31/2022 03:51   ? ? ?PROCEDURES: ? ?Critical Care performed: No ? ?.1-3 Lead EKG Interpretation ?Performed by: Irean HongSung, Brinlyn Cena J, MD ?Authorized by: Irean HongSung, Emberlin Verner J, MD  ? ?  Interpretation: normal   ?  ECG rate:  65 ?  ECG rate assessment: normal   ?  Rhythm: sinus rhythm   ?  Ectopy: none   ?  Conduction: normal   ?Comments:  ?   Patient placed on cardiac monitor to evaluate for arrhythmias ? ? ?MEDICATIONS ORDERED IN ED: ?Medications  ?ketorolac (TORADOL) 30 MG/ML injection 9.9 mg (9.9 mg Intravenous Given 01/31/22 0413)  ?LORazepam (ATIVAN) injection 1 mg (1 mg Intravenous Given 01/31/22 0413)  ? ? ? ?IMPRESSION / MDM / ASSESSMENT AND PLAN / ED COURSE  ?I reviewed the triage vital signs and the nursing notes. ?             ?               ?26 year old male presenting with chest pain, palpitations and anxiety. Differential diagnosis includes, but is not limited to, ACS, aortic dissection, pulmonary embolism, cardiac tamponade, pneumothorax, pneumonia, pericarditis, myocarditis, GI-related causes including esophagitis/gastritis, and musculoskeletal chest wall pain.   I have personally reviewed patient's chart and note his urgent care office visit from 03/17/2021 for chest pain and palpitations. ? ?The patient is on the cardiac monitor to evaluate for evidence of arrhythmia and/or significant heart rate changes. ? ?We will obtain cardiac panel, check thyroid panel, and a  D-dimer.  Oral temperature 98.7 ?F. Will administer IV ketorolac and low-dose IV Ativan.  Will reassess. ? ?Clinical Course as of 01/31/22 0645  ?Mon Jan 31, 2022  ?0530 Updated patient and his mother on all test results thus far.  Normal WBC, normal electrolytes, negative D-dimer, negative troponin, normal thyroid panel.  Chest x-ray unremarkable.  Patient is feeling overall better.  Will repeat  troponin. [JS]  ?0608 Updated patient and his mother on negative repeat troponin.  Will refer to cardiology for outpatient follow-up.  Strict return precautions given.  Both verbalized understanding and agree with plan of care. [JS]  ?  ?Clinical Course User Index ?[JS] Irean Hong, MD  ? ? ? ?FINAL CLINICAL IMPRESSION(S) / ED DIAGNOSES  ? ?Final diagnoses:  ?Nonspecific chest pain  ?Anxiety  ?Palpitations  ? ? ? ?Rx / DC Orders  ? ?ED Discharge Orders   ? ? None  ? ?  ? ? ? ?Note:  This document was prepared using Dragon voice recognition software and may include unintentional dictation errors. ?  ?Irean Hong, MD ?01/31/22 [phone] ? ?

## 2022-01-31 NOTE — ED Triage Notes (Signed)
Pt has had chest pain off and on for 3 days. Pt states this morning woke up with left arm numbness/pain.  ?

## 2022-01-31 NOTE — ED Notes (Signed)
Called lab to add on d-dimer and tsh ?

## 2022-03-01 ENCOUNTER — Ambulatory Visit: Payer: BC Managed Care – PPO | Admitting: Cardiology

## 2022-03-02 ENCOUNTER — Encounter: Payer: Self-pay | Admitting: Cardiology

## 2022-05-26 ENCOUNTER — Telehealth: Payer: BC Managed Care – PPO | Admitting: Physician Assistant

## 2022-05-26 DIAGNOSIS — F411 Generalized anxiety disorder: Secondary | ICD-10-CM

## 2022-05-26 MED ORDER — SERTRALINE HCL 50 MG PO TABS: 50.0000 mg | ORAL_TABLET | Freq: Every day | ORAL | 0 refills | Status: DC

## 2022-05-26 NOTE — Patient Instructions (Signed)
Johnny Stein, thank you for joining Margaretann Loveless, PA-C for today's virtual visit.  While this provider is not your primary care provider (PCP), if your PCP is located in our provider database this encounter information will be shared with them immediately following your visit.  Consent: (Patient) Johnny Stein provided verbal consent for this virtual visit at the beginning of the encounter.  Current Medications:  Current Outpatient Medications:    sertraline (ZOLOFT) 50 MG tablet, Take 1 tablet (50 mg total) by mouth daily., Disp: 90 tablet, Rfl: 0   Multiple Vitamin (MULTIVITAMIN WITH MINERALS) TABS tablet, Take 1 tablet by mouth daily., Disp: , Rfl:    Medications ordered in this encounter:  Meds ordered this encounter  Medications   sertraline (ZOLOFT) 50 MG tablet    Sig: Take 1 tablet (50 mg total) by mouth daily.    Dispense:  90 tablet    Refill:  0    Order Specific Question:   Supervising Provider    Answer:   Hyacinth Meeker, BRIAN [[id number]]     *If you need refills on other medications prior to your next appointment, please contact your pharmacy*  Follow-Up: Call back or seek an in-person evaluation if the symptoms worsen or if the condition fails to improve as anticipated.  Other Instructions Managing Anxiety, Adult After being diagnosed with anxiety, you may be relieved to know why you have felt or behaved a certain way. You may also feel overwhelmed about the treatment ahead and what it will mean for your life. With care and support, you can manage this condition. How to manage lifestyle changes Managing stress and anxiety  Stress is your body's reaction to life changes and events, both good and bad. Most stress will last just a few hours, but stress can be ongoing and can lead to more than just stress. Although stress can play a major role in anxiety, it is not the same as anxiety. Stress is usually caused by something external, such as a deadline, test, or  competition. Stress normally passes after the triggering event has ended.  Anxiety is caused by something internal, such as imagining a terrible outcome or worrying that something will go wrong that will devastate you. Anxiety often does not go away even after the triggering event is over, and it can become long-term (chronic) worry. It is important to understand the differences between stress and anxiety and to manage your stress effectively so that it does not lead to an anxious response. Talk with your health care provider or a counselor to learn more about reducing anxiety and stress. He or she may suggest tension reduction techniques, such as: Music therapy. Spend time creating or listening to music that you enjoy and that inspires you. Mindfulness-based meditation. Practice being aware of your normal breaths while not trying to control your breathing. It can be done while sitting or walking. Centering prayer. This involves focusing on a word, phrase, or sacred image that means something to you and brings you peace. Deep breathing. To do this, expand your stomach and inhale slowly through your nose. Hold your breath for 3-5 seconds. Then exhale slowly, letting your stomach muscles relax. Self-talk. Learn to notice and identify thought patterns that lead to anxiety reactions and change those patterns to thoughts that feel peaceful. Muscle relaxation. Taking time to tense muscles and then relax them. Choose a tension reduction technique that fits your lifestyle and personality. These techniques take time and practice. Set aside 5-15 minutes  a day to do them. Therapists can offer counseling and training in these techniques. The training to help with anxiety may be covered by some insurance plans. Other things you can do to manage stress and anxiety include: Keeping a stress diary. This can help you learn what triggers your reaction and then learn ways to manage your response. Thinking about how you  react to certain situations. You may not be able to control everything, but you can control your response. Making time for activities that help you relax and not feeling guilty about spending your time in this way. Doing visual imagery. This involves imagining or creating mental pictures to help you relax. Practicing yoga. Through yoga poses, you can lower tension and promote relaxation.  Medicines Medicines can help ease symptoms. Medicines for anxiety include: Antidepressant medicines. These are usually prescribed for long-term daily control. Anti-anxiety medicines. These may be added in severe cases, especially when panic attacks occur. Medicines will be prescribed by a health care provider. When used together, medicines, psychotherapy, and tension reduction techniques may be the most effective treatment. Relationships Relationships can play a big part in helping you recover. Try to spend more time connecting with trusted friends and family members. Consider going to couples counseling if you have a partner, taking family education classes, or going to family therapy. Therapy can help you and others better understand your condition. How to recognize changes in your anxiety Everyone responds differently to treatment for anxiety. Recovery from anxiety happens when symptoms decrease and stop interfering with your daily activities at home or work. This may mean that you will start to: Have better concentration and focus. Worry will interfere less in your daily thinking. Sleep better. Be less irritable. Have more energy. Have improved memory. It is also important to recognize when your condition is getting worse. Contact your health care provider if your symptoms interfere with home or work and you feel like your condition is not improving. Follow these instructions at home: Activity Exercise. Adults should do the following: Exercise for at least 150 minutes each week. The exercise should  increase your heart rate and make you sweat (moderate-intensity exercise). Strengthening exercises at least twice a week. Get the right amount and quality of sleep. Most adults need 7-9 hours of sleep each night. Lifestyle  Eat a healthy diet that includes plenty of vegetables, fruits, whole grains, low-fat dairy products, and lean protein. Do not eat a lot of foods that are high in fats, added sugars, or salt (sodium). Make choices that simplify your life. Do not use any products that contain nicotine or tobacco. These products include cigarettes, chewing tobacco, and vaping devices, such as e-cigarettes. If you need help quitting, ask your health care provider. Avoid caffeine, alcohol, and certain over-the-counter cold medicines. These may make you feel worse. Ask your pharmacist which medicines to avoid. General instructions Take over-the-counter and prescription medicines only as told by your health care provider. Keep all follow-up visits. This is important. Where to find support You can get help and support from these sources: Self-help groups. Online and [employer]. A trusted spiritual leader. Couples counseling. Family education classes. Family therapy. Where to find more information You may find that joining a support group helps you deal with your anxiety. The following sources can help you locate counselors or support groups near you: Mental Health America: www.mentalhealthamerica.net Anxiety and Depression Association of [place] (ADAA): ProgramCam.de [employer] on Mental Illness (NAMI): www.nami.org Contact a health care provider if: You  have a hard time staying focused or finishing daily tasks. You spend many hours a day feeling worried about everyday life. You become exhausted by worry. You start to have headaches or frequently feel tense. You develop chronic nausea or diarrhea. Get help right away if: You have a racing heart and shortness of  breath. You have thoughts of hurting yourself or others. If you ever feel like you may hurt yourself or others, or have thoughts about taking your own life, get help right away. Go to your nearest emergency department or: Call your local emergency services (911 in the [place] Call a suicide crisis helpline, such as the National Suicide Prevention Lifeline at 919-564-0168 or 988 in the [place] This is open 24 hours a day in the [place] Text the Crisis Text Line at (959)299-8942 (in the [place] Summary Taking steps to learn and use tension reduction techniques can help calm you and help prevent triggering an anxiety reaction. When used together, medicines, psychotherapy, and tension reduction techniques may be the most effective treatment. Family, friends, and partners can play a big part in supporting you. This information is not intended to replace advice given to you by your health care provider. Make sure you discuss any questions you have with your health care provider. Document Revised: 05/26/2021 Document Reviewed: 02/21/2021 Elsevier Patient Education  2023 Elsevier Inc.    If you have been instructed to have an in-person evaluation today at a local Urgent Care facility, please use the link below. It will take you to a list of all of our available [facility], including address, phone number and hours of operation. Please do not delay care.  [facility]  If you or a family member do not have a primary care provider, use the link below to schedule a visit and establish care. When you choose a [facility] primary care physician or advanced practice provider, you gain a long-term partner in health. Find a Primary Care Provider  Learn more about [facility]'s in-office and virtual care options: [facility] - Get Care Now

## 2022-05-26 NOTE — Progress Notes (Signed)
Virtual Visit Consent   Johnny Stein, you are scheduled for a virtual visit with a [facility] provider today. Just as with appointments in the office, your consent must be obtained to participate. Your consent will be active for this visit and any virtual visit you may have with one of our providers in the next 365 days. If you have a MyChart account, a copy of this consent can be sent to you electronically.  As this is a virtual visit, video technology does not allow for your provider to perform a traditional examination. This may limit your provider's ability to fully assess your condition. If your provider identifies any concerns that need to be evaluated in person or the need to arrange testing (such as labs, EKG, etc.), we will make arrangements to do so. Although advances in technology are sophisticated, we cannot ensure that it will always work on either your end or our end. If the connection with a video visit is poor, the visit may have to be switched to a telephone visit. With either a video or telephone visit, we are not always able to ensure that we have a secure connection.  By engaging in this virtual visit, you consent to the provision of healthcare and authorize for your insurance to be billed (if applicable) for the services provided during this visit. Depending on your insurance coverage, you may receive a charge related to this service.  I need to obtain your verbal consent now. Are you willing to proceed with your visit today? KWINTON MAAHS has provided verbal consent on 05/26/2022 for a virtual visit (video or telephone). Margaretann Loveless, PA-C  Date: 05/26/2022 8:53 AM  Virtual Visit via Video Note   I, Margaretann Loveless, connected with  Johnny Stein  ([id number], 1996/05/04) on 05/26/22 at  8:45 AM EDT by a video-enabled telemedicine application and verified that I am speaking with the correct person using two identifiers.  Location: Patient: Virtual Visit  Location Patient: Home Provider: Virtual Visit Location Provider: Home Office   I discussed the limitations of evaluation and management by telemedicine and the availability of in person appointments. The patient expressed understanding and agreed to proceed.    History of Present Illness: Johnny Stein is a 26 y.o. who identifies as a male who was assigned male at birth, and is being seen today for anxiety.  Went online and was started on Sertraline 50mg  through . Is tolerating well and has no complaints. Would like to continue medication, but getting it from this website was costing him $180 as they do not file insurance and he could not afford that. He is trying to establish care with a local PCP, but none are available before his medication runs out.    Problems:  Patient Active Problem List   Diagnosis Date Noted   Acute appendicitis 11/13/2019   Acute perforated appendicitis 10/08/2019   Ingrown toenail 02/11/2016   Abdominal pain 10/29/2015    Allergies: No Known Allergies Medications:  Current Outpatient Medications:    sertraline (ZOLOFT) 50 MG tablet, Take 1 tablet (50 mg total) by mouth daily., Disp: 90 tablet, Rfl: 0   Multiple Vitamin (MULTIVITAMIN WITH MINERALS) TABS tablet, Take 1 tablet by mouth daily., Disp: , Rfl:   Observations/Objective: Patient is well-developed, well-nourished in no acute distress.  Resting comfortably at home.  Head is normocephalic, atraumatic.  No labored breathing.  Speech is clear and coherent with logical content.  Patient is alert and oriented  at baseline.    Assessment and Plan: 1. GAD (generalized anxiety disorder) - sertraline (ZOLOFT) 50 MG tablet; Take 1 tablet (50 mg total) by mouth daily.  Dispense: 90 tablet; Refill: 0  - Medication refilled x 90 days - Discussed establishing with PCP using link in AVS - He voices understanding  Follow Up Instructions: I discussed the assessment and treatment plan with the  patient. The patient was provided an opportunity to ask questions and all were answered. The patient agreed with the plan and demonstrated an understanding of the instructions.  A copy of instructions were sent to the patient via MyChart unless otherwise noted below.    The patient was advised to call back or seek an in-person evaluation if the symptoms worsen or if the condition fails to improve as anticipated.  Time:  I spent 14 minutes with the patient via telehealth technology discussing the above problems/concerns.    Margaretann Loveless, PA-C

## 2022-07-10 ENCOUNTER — Ambulatory Visit
Admission: RE | Admit: 2022-07-10 | Discharge: 2022-07-10 | Disposition: A | Discharge status: Home / Self Care | Payer: BC Managed Care – PPO | Source: Ambulatory Visit | Attending: Emergency Medicine | Admitting: Emergency Medicine

## 2022-07-10 VITALS — BP 142/97 | HR 76 | Temp 98.3°F | Resp 16

## 2022-07-10 DIAGNOSIS — J351 Hypertrophy of tonsils: Secondary | ICD-10-CM | POA: Insufficient documentation

## 2022-07-10 DIAGNOSIS — Z113 Encounter for screening for infections with a predominantly sexual mode of transmission: Secondary | ICD-10-CM | POA: Insufficient documentation

## 2022-07-10 DIAGNOSIS — B9689 Other specified bacterial agents as the cause of diseases classified elsewhere: Secondary | ICD-10-CM | POA: Insufficient documentation

## 2022-07-10 DIAGNOSIS — J038 Acute tonsillitis due to other specified organisms: Secondary | ICD-10-CM | POA: Insufficient documentation

## 2022-07-10 LAB — POCT RAPID STREP A (OFFICE): Rapid Strep A Screen: NEGATIVE

## 2022-07-10 MED ORDER — CEFDINIR 300 MG PO CAPS: 300.0000 mg | ORAL_CAPSULE | Freq: Two times a day (BID) | ORAL | 0 refills | Status: AC

## 2022-07-10 NOTE — ED Provider Notes (Signed)
[facility]    CSN: [id number] Arrival date & time: 07/10/22  1232    HISTORY   Chief Complaint  Patient presents with   Sore Throat    Swollen tonsils, sore throat - Entered by patient   HPI Johnny Stein is a pleasant, 26 y.o. male who presents to urgent care today. The patient c/o swelling of both tonsils and pain with swallowing.  Patient states he tested positive for COVID 2 weeks ago but the swelling of his tonsils occurred a few days ago.  Patient denies known sick contacts.  Patient states has not been having headache, vomiting, body aches, chills fever, nausea, diarrhea.  Patient reports engaging in oral sex without the use of protection, and is requesting oral STD testing at this time.  The history is provided by the patient.   Past Medical History:  Diagnosis Date   GERD (gastroesophageal reflux disease)    IBS (irritable bowel syndrome)    Patient Active Problem List   Diagnosis Date Noted   Acute appendicitis 11/13/2019   Acute perforated appendicitis 10/08/2019   Ingrown toenail 02/11/2016   Abdominal pain 10/29/2015   Past Surgical History:  Procedure Laterality Date   FEMUR HARDWARE REMOVAL     FEMUR SURGERY Bilateral    LAPAROSCOPIC APPENDECTOMY N/A 12/02/2019   Procedure: APPENDECTOMY LAPAROSCOPIC;  Surgeon: Duanne Guessannon, Jennifer, MD;  Location: [facility] ORS;  Service: General;  Laterality: N/A;   WISDOM TOOTH EXTRACTION      Home Medications    Prior to Admission medications   Medication Sig Start Date End Date Taking? Authorizing Provider  Multiple Vitamin (MULTIVITAMIN WITH MINERALS) TABS tablet Take 1 tablet by mouth daily.    [provider]  sertraline (ZOLOFT) 50 MG tablet Take 1 tablet (50 mg total) by mouth daily. 05/26/22   Margaretann LovelessBurnette, Jennifer M, PA-C    Family History Family History  Problem Relation Age of Onset   Hypertension Mother    Diabetes Father    Social History Social History   Tobacco Use   Smoking status:  Light Smoker    Types: Cigarettes   Smokeless tobacco: Never  Vaping Use   Vaping Use: Every day   Substances: Nicotine, Flavoring, Nicotine-salt  Substance Use Topics   Alcohol use: Yes    Alcohol/week: 1.0 standard drink of alcohol    Types: 1 Shots of liquor per week   Drug use: Not Currently    Types: Marijuana   Allergies   Patient has no known allergies.  Review of Systems Review of Systems Pertinent findings revealed after performing a 14 point review of systems has been noted in the history of present illness.  Physical Exam Triage Vital Signs ED Triage Vitals  Enc Vitals Group     BP 09/10/21 0827 (!) 147/82     Pulse Rate 09/10/21 0827 72     Resp 09/10/21 0827 18     Temp 09/10/21 0827 98.3 F (36.8 C)     Temp Source 09/10/21 0827 Oral     SpO2 09/10/21 0827 98 %     Weight --      Height --      Head Circumference --      Peak Flow --      Pain Score 09/10/21 0826 5     Pain Loc --      Pain Edu? --      Excl. in GC? --   No data found.  Updated Vital Signs BP Marland Kitchen(!)  142/97 (BP Location: Left Arm)   Pulse 76   Temp 98.3 F (36.8 C) (Oral)   Resp 16   SpO2 95%   Physical Exam Vitals and nursing note reviewed.  Constitutional:      General: He is not in acute distress.    Appearance: Normal appearance. He is well-developed. He is not ill-appearing or toxic-appearing.  HENT:     Head: Normocephalic and atraumatic.     Salivary Glands: Right salivary gland is diffusely enlarged and tender. Left salivary gland is diffusely enlarged and tender.     Right Ear: Hearing, tympanic membrane, ear canal and external ear normal. No drainage. No middle ear effusion. There is no impacted cerumen. Tympanic membrane is not erythematous or bulging.     Left Ear: Hearing, tympanic membrane, ear canal and external ear normal. No drainage.  No middle ear effusion. There is no impacted cerumen. Tympanic membrane is not erythematous or bulging.     Ears:     Comments:  Bilateral EACs with mild erythema, bilateral TMs are normal    Nose: Nose normal. No nasal deformity, septal deviation, mucosal edema, congestion or rhinorrhea.     Right Turbinates: Not enlarged, swollen or pale.     Left Turbinates: Not enlarged, swollen or pale.     Right Sinus: No maxillary sinus tenderness or frontal sinus tenderness.     Left Sinus: No maxillary sinus tenderness or frontal sinus tenderness.     Mouth/Throat:     Lips: Pink. No lesions.     Mouth: Mucous membranes are moist. No oral lesions or angioedema.     Dentition: No gingival swelling.     Tongue: No lesions.     Palate: No mass.     Pharynx: Uvula midline. Pharyngeal swelling, oropharyngeal exudate and posterior oropharyngeal erythema present. No uvula swelling.     Tonsils: Tonsillar exudate present. 2+ on the right. 2+ on the left.  Eyes:     General: Lids are normal.        Right eye: No discharge.        Left eye: No discharge.     Extraocular Movements: Extraocular movements intact.     Conjunctiva/sclera: Conjunctivae normal.     Right eye: Right conjunctiva is not injected.     Left eye: Left conjunctiva is not injected.     Pupils: Pupils are equal, round, and reactive to light.  Neck:     Thyroid: No thyroid mass, thyromegaly or thyroid tenderness.     Trachea: Phonation normal. Tracheal tenderness present. No abnormal tracheal secretions or tracheal deviation.     Comments: Voice is muffled Cardiovascular:     Rate and Rhythm: Normal rate and regular rhythm.     Pulses: Normal pulses.     Heart sounds: Normal heart sounds, S1 normal and S2 normal. No murmur heard.    No friction rub. No gallop.  Pulmonary:     Effort: Pulmonary effort is normal. No accessory muscle usage, prolonged expiration, respiratory distress or retractions.     Breath sounds: Normal breath sounds. No stridor, decreased air movement or transmitted upper airway sounds. No decreased breath sounds, wheezing, rhonchi or rales.   Chest:     Chest wall: No tenderness.  Abdominal:     General: Bowel sounds are normal.     Palpations: Abdomen is soft.     Tenderness: There is generalized abdominal tenderness. There is no right CVA tenderness, left CVA tenderness or rebound. Negative signs  include Murphy's sign.     Hernia: No hernia is present.  Musculoskeletal:        General: No tenderness. Normal range of motion.     Cervical back: Full passive range of motion without pain, normal range of motion and neck supple. Normal range of motion.     Right lower leg: No edema.     Left lower leg: No edema.  Lymphadenopathy:     Cervical: Cervical adenopathy present.     Right cervical: Superficial cervical adenopathy present.     Left cervical: Superficial cervical adenopathy present.  Skin:    General: Skin is warm and dry.     Findings: No erythema, lesion or rash.  Neurological:     General: No focal deficit present.     Mental Status: He is alert and oriented to person, place, and time. Mental status is at baseline.  Psychiatric:        Mood and Affect: Mood normal.        Behavior: Behavior normal.        Thought Content: Thought content normal.        Judgment: Judgment normal.     Visual Acuity Right Eye Distance:   Left Eye Distance:   Bilateral Distance:    Right Eye Near:   Left Eye Near:    Bilateral Near:     [facility] Couse / Diagnostics / Procedures:     Radiology No results found.  Procedures Procedures (including critical care time) EKG  Pending results:  Labs Reviewed  CULTURE, GROUP A STREP [facility])  POCT RAPID STREP A (OFFICE)  CYTOLOGY, (ORAL, ANAL, URETHRAL) ANCILLARY ONLY    Medications Ordered in UC: Medications - No data to display  UC Diagnoses / Final Clinical Impressions(s)   I have reviewed the triage vital signs and the nursing notes.  Pertinent labs & imaging results that were available during my care of the patient were reviewed by me and considered in my medical  decision making (see chart for details).    Final diagnoses:  Enlarged tonsils  Acute bacterial tonsillitis  Screening examination for STD (sexually transmitted disease)   Rapid strep test is negative, throat culture pending.  Based on physical exam findings will begin patient empirically on antibiotics for presumed bacterial tonsillitis.  While my suspicion for oral STD is low based on physical exam findings, will perform cytology as requested.  We will treat patient further based on those results if needed.  Return precautions advised.  ED Prescriptions     Medication Sig Dispense Auth. Provider   cefdinir (OMNICEF) 300 MG capsule Take 1 capsule (300 mg total) by mouth 2 (two) times daily for 10 days. 20 capsule Theadora Rama Scales, PA-C      PDMP not reviewed this encounter.  Disposition Upon Discharge:  Condition: stable for discharge home Home: take medications as prescribed; routine discharge instructions as discussed; follow up as advised.  Patient presented with an acute illness with associated systemic symptoms and significant discomfort requiring urgent management. In my opinion, this is a condition that a prudent lay person (someone who possesses an average knowledge of health and medicine) may potentially expect to result in complications if not addressed urgently such as respiratory distress, impairment of bodily function or dysfunction of bodily organs.   Routine symptom specific, illness specific and/or disease specific instructions were discussed with the patient and/or caregiver at length.   As such, the patient has been evaluated and assessed, work-up was performed  and treatment was provided in alignment with urgent care protocols and evidence based medicine.  Patient/parent/caregiver has been advised that the patient may require follow up for further testing and treatment if the symptoms continue in spite of treatment, as clinically indicated and appropriate.  If the  patient was tested for COVID-19, Influenza and/or RSV, then the patient/parent/guardian was advised to isolate at home pending the results of his/her diagnostic coronavirus test and potentially longer if they're positive. I have also advised pt that if his/her COVID-19 test returns positive, it's recommended to self-isolate for at least 10 days after symptoms first appeared AND until fever-free for 24 hours without fever reducer AND other symptoms have improved or resolved. Discussed self-isolation recommendations as well as instructions for household member/close contacts as per the [facility] and [place] [facility]HS, and also gave patient the COVID packet with this information.  Patient/parent/caregiver has been advised to return to the [facility] or PCP in 3-5 days if no better; to PCP or the Emergency Department if new signs and symptoms develop, or if the current signs or symptoms continue to change or worsen for further workup, evaluation and treatment as clinically indicated and appropriate  The patient will follow up with their current PCP if and as advised. If the patient does not currently have a PCP we will assist them in obtaining one.   The patient may need specialty follow up if the symptoms continue, in spite of conservative treatment and management, for further workup, evaluation, consultation and treatment as clinically indicated and appropriate.  Patient/parent/caregiver verbalized understanding and agreement of plan as discussed.  All questions were addressed during visit.  Please see discharge instructions below for further details of plan.  Discharge Instructions:   Discharge Instructions      Your strep test today is negative.  Streptococcal throat culture will be performed per our protocol, please keep in mind that the rapid strep test that we perform here at urgent care only catches 40% of strep throat infections.   Based on my physical exam findings and the history you provided to me today, I  recommend that you begin antibiotics now for presumed strep throat instead of waiting for the strep culture result.  I have sent a prescription to your pharmacy.  After 24 hours of antibiotics, you should begin to feel significantly better.     After 24 hours of taking antibiotics, please discard your toothbrush as well as any other oral devices that you are currently using and replace them with new ones to avoid reinfection.   If your streptococcal throat culture has a negative result but you feel significantly better after taking antibiotics for 24 to 48 hours, I strongly recommend that you finish the full 10-day course.  Bacterial culture tests are only as reliable as the laboratory technician performing them.  Alternately, if your streptococcal throat culture has a negative result and you see no improvement of your symptoms after 24 to 48 hours of antibiotics, please discontinue the antibiotics as they are no longer indicated.  Your throat infection will then be most likely considered viral and will have to resolve on its own.   Please see the list below for recommended medications, dosages and frequencies to provide relief of your current symptoms:    Omnicef (cefdinir):  1 capsule twice daily for 10 days, you can take it with or without food.  This antibiotic can cause upset stomach, this will resolve once antibiotics are complete.  You are welcome to use a  probiotic, eat yogurt, take Imodium while taking this medication.  Please avoid other systemic medications such as Maalox, Pepto-Bismol or milk of magnesia as they can interfere with your body's ability to absorb the antibiotics.   Advil, Motrin (ibuprofen): This is a good anti-inflammatory medication which addresses aches, pains and inflammation of the upper airways that causes sinus and nasal congestion as well as in the lower airways which makes your cough feel tight and sometimes burn.  I recommend that you take between 400 to 600 mg every 6-8  hours as needed.      Tylenol (acetaminophen): This is a good fever reducer.  If your body temperature rises above 101.5 as measured with a thermometer, it is recommended that you take 1,000 mg every 8 hours until your temperature falls below 101.5, please not take more than 3,000 mg of acetaminophen either as a separate medication or as in ingredient in an over-the-counter cold/flu preparation within a 24-hour period.      Chloraseptic Throat Spray: Spray 5 sprays into affected area every 2 hours, hold for 15 seconds and either swallow or spit it out.  This is a excellent numbing medication because it is a spray, you can put it right where you needed and so sucking on a lozenge and numbing your entire mouth.      The results of your oral STD testing today which tests for gonorrhea, chlamydia and trichomonas will be posted to your MyChart account in the next 3 to 5 days.  If any of your results are abnormal, you will receive a phone call regarding further treatment.  Additional prescriptions, if any are needed, will be provided for you at your pharmacy.   Please abstain from sexual intercourse of any kind, vaginal, oral or anal, until until you have received the results of your STD testing.  Please follow-up within the next 5-7 days either with your primary care provider or urgent care if your symptoms do not resolve.  If you do not have a primary care provider, we will assist you in finding one.   Thank you for visiting urgent care today.  We appreciate the opportunity to participate in your care.        This office note has been dictated using Teaching laboratory technician.  Unfortunately, this method of dictation can sometimes lead to typographical or grammatical errors.  I apologize for your inconvenience in advance if this occurs.  Please do not hesitate to reach out to me if clarification is needed.      Theadora Rama Scales, PA-C 07/10/22 1425

## 2022-07-10 NOTE — ED Triage Notes (Signed)
The patient c/o swelling to in tonsil region. The patient states he was positive for covid 2 weeks ago.

## 2022-07-10 NOTE — Discharge Instructions (Signed)
Your strep test today is negative.  Streptococcal throat culture will be performed per our protocol, please keep in mind that the rapid strep test that we perform here at urgent care only catches 40% of strep throat infections.   Based on my physical exam findings and the history you provided to me today, I recommend that you begin antibiotics now for presumed strep throat instead of waiting for the strep culture result.  I have sent a prescription to your pharmacy.  After 24 hours of antibiotics, you should begin to feel significantly better.     After 24 hours of taking antibiotics, please discard your toothbrush as well as any other oral devices that you are currently using and replace them with new ones to avoid reinfection.   If your streptococcal throat culture has a negative result but you feel significantly better after taking antibiotics for 24 to 48 hours, I strongly recommend that you finish the full 10-day course.  Bacterial culture tests are only as reliable as the laboratory technician performing them.  Alternately, if your streptococcal throat culture has a negative result and you see no improvement of your symptoms after 24 to 48 hours of antibiotics, please discontinue the antibiotics as they are no longer indicated.  Your throat infection will then be most likely considered viral and will have to resolve on its own.   Please see the list below for recommended medications, dosages and frequencies to provide relief of your current symptoms:    Omnicef (cefdinir):  1 capsule twice daily for 10 days, you can take it with or without food.  This antibiotic can cause upset stomach, this will resolve once antibiotics are complete.  You are welcome to use a probiotic, eat yogurt, take Imodium while taking this medication.  Please avoid other systemic medications such as Maalox, Pepto-Bismol or milk of magnesia as they can interfere with your body's ability to absorb the antibiotics.   Advil,  Motrin (ibuprofen): This is a good anti-inflammatory medication which addresses aches, pains and inflammation of the upper airways that causes sinus and nasal congestion as well as in the lower airways which makes your cough feel tight and sometimes burn.  I recommend that you take between 400 to 600 mg every 6-8 hours as needed.      Tylenol (acetaminophen): This is a good fever reducer.  If your body temperature rises above 101.5 as measured with a thermometer, it is recommended that you take 1,000 mg every 8 hours until your temperature falls below 101.5, please not take more than 3,000 mg of acetaminophen either as a separate medication or as in ingredient in an over-the-counter cold/flu preparation within a 24-hour period.      Chloraseptic Throat Spray: Spray 5 sprays into affected area every 2 hours, hold for 15 seconds and either swallow or spit it out.  This is a excellent numbing medication because it is a spray, you can put it right where you needed and so sucking on a lozenge and numbing your entire mouth.      The results of your oral STD testing today which tests for gonorrhea, chlamydia and trichomonas will be posted to your MyChart account in the next 3 to 5 days.  If any of your results are abnormal, you will receive a phone call regarding further treatment.  Additional prescriptions, if any are needed, will be provided for you at your pharmacy.   Please abstain from sexual intercourse of any kind, vaginal, oral or anal,  until until you have received the results of your STD testing.  Please follow-up within the next 5-7 days either with your primary care provider or urgent care if your symptoms do not resolve.  If you do not have a primary care provider, we will assist you in finding one.   Thank you for visiting urgent care today.  We appreciate the opportunity to participate in your care.

## 2022-07-11 ENCOUNTER — Inpatient Hospital Stay (HOSPITAL_COMMUNITY): Admission: RE | Admit: 2022-07-11 | Payer: BC Managed Care – PPO | Source: Ambulatory Visit

## 2022-07-11 LAB — CYTOLOGY, (ORAL, ANAL, URETHRAL) ANCILLARY ONLY
Chlamydia: NEGATIVE
Comment: NEGATIVE
Comment: NEGATIVE
Comment: NORMAL
Neisseria Gonorrhea: NEGATIVE
Trichomonas: NEGATIVE

## 2022-07-13 LAB — CULTURE, GROUP A STREP (THRC)

## 2022-09-02 DIAGNOSIS — K589 Irritable bowel syndrome without diarrhea: Secondary | ICD-10-CM | POA: Diagnosis not present

## 2022-09-02 DIAGNOSIS — Z7251 High risk heterosexual behavior: Secondary | ICD-10-CM | POA: Diagnosis not present

## 2022-09-02 DIAGNOSIS — F339 Major depressive disorder, recurrent, unspecified: Secondary | ICD-10-CM | POA: Diagnosis not present

## 2022-09-02 DIAGNOSIS — F411 Generalized anxiety disorder: Secondary | ICD-10-CM | POA: Diagnosis not present

## 2022-09-02 DIAGNOSIS — Z23 Encounter for immunization: Secondary | ICD-10-CM | POA: Diagnosis not present

## 2022-09-02 DIAGNOSIS — Z1331 Encounter for screening for depression: Secondary | ICD-10-CM | POA: Diagnosis not present

## 2023-01-07 ENCOUNTER — Other Ambulatory Visit: Payer: Self-pay

## 2023-01-07 ENCOUNTER — Ambulatory Visit
Admission: EM | Admit: 2023-01-07 | Discharge: 2023-01-07 | Disposition: A | Discharge status: Home / Self Care | Payer: BC Managed Care – PPO

## 2023-01-07 ENCOUNTER — Encounter: Payer: Self-pay | Admitting: Emergency Medicine

## 2023-01-07 DIAGNOSIS — H1089 Other conjunctivitis: Secondary | ICD-10-CM

## 2023-01-07 DIAGNOSIS — S0502XA Injury of conjunctiva and corneal abrasion without foreign body, left eye, initial encounter: Secondary | ICD-10-CM

## 2023-01-07 DIAGNOSIS — Y7711 Contact lens associated with adverse incidents: Secondary | ICD-10-CM

## 2023-01-07 MED ORDER — OFLOXACIN 0.3 % OP SOLN: 1.0000 [drp] | Freq: Four times a day (QID) | OPHTHALMIC | 0 refills | Status: DC

## 2023-01-07 NOTE — Discharge Instructions (Signed)
You may use lubricating eye ointment throughout the day as needed, cool compresses, ibuprofen for pain in addition to the antibiotic drops.  Wear your glasses until fully resolved to avoid further irritation.  Follow-up with your eye specialist if not resolving

## 2023-01-07 NOTE — ED Provider Notes (Signed)
[facility]    CSN: [id number] Arrival date & time: 01/07/23  0808      History   Chief Complaint Chief Complaint  Patient presents with   Eye Problem    HPI Johnny Stein is a 27 y.o. male.   Patient presenting today with several day history of left eye redness, irritation, clear drainage, photophobia.  States symptoms started after he fell asleep with his contacts in.  He also states he has a bad habit of rubbing his contacts around in circles when he puts them in.  He is wondering if these things cause some irritation.  Did a televisit yesterday and was given tobramycin drops and has not noticed much of a benefit.  Denies headache, fever, nausea, vomiting, severe eye pain.    Past Medical History:  Diagnosis Date   GERD (gastroesophageal reflux disease)    IBS (irritable bowel syndrome)     Patient Active Problem List   Diagnosis Date Noted   Acute appendicitis 11/13/2019   Acute perforated appendicitis 10/08/2019   Ingrown toenail 02/11/2016   Abdominal pain 10/29/2015    Past Surgical History:  Procedure Laterality Date   FEMUR HARDWARE REMOVAL     FEMUR SURGERY Bilateral    LAPAROSCOPIC APPENDECTOMY N/A 12/02/2019   Procedure: APPENDECTOMY LAPAROSCOPIC;  Surgeon: Fredirick Maudlin, MD;  Location: [facility];  Service: General;  Laterality: N/A;   WISDOM TOOTH EXTRACTION         Home Medications    Prior to Admission medications   Medication Sig Start Date End Date Taking? Authorizing Provider  ofloxacin (OCUFLOX) 0.3 % ophthalmic solution Place 1 drop into the left eye 4 (four) times daily. 01/07/23  Yes Volney American, PA-C  amphetamine-dextroamphetamine (ADDERALL XR) 10 MG 24 hr capsule Take 10 mg by mouth daily.    [provider]  emtricitabine-tenofovir (TRUVADA) 200-300 TABS Prepack Take 1 each by mouth once.    [provider]  Multiple Vitamin (MULTIVITAMIN WITH MINERALS) TABS tablet Take 1 tablet by mouth  daily.    [provider]  sertraline (ZOLOFT) 50 MG tablet Take 1 tablet (50 mg total) by mouth daily. Patient not taking: Reported on 01/07/2023 05/26/22   Mar Daring, PA-C    Family History Family History  Problem Relation Age of Onset   Hypertension Mother    Diabetes Father     Social History Social History   Tobacco Use   Smoking status: Light Smoker    Types: Cigarettes   Smokeless tobacco: Never  Vaping Use   Vaping Use: Every day   Substances: Nicotine, Flavoring, Nicotine-salt  Substance Use Topics   Alcohol use: Yes    Alcohol/week: 1.0 standard drink of alcohol    Types: 1 Shots of liquor per week   Drug use: Not Currently    Types: Marijuana     Allergies   Patient has no known allergies.   Review of Systems Review of Systems PER HPI  Physical Exam Triage Vital Signs ED Triage Vitals  Enc Vitals Group     BP 01/07/23 0829 (!) 138/95     Pulse Rate 01/07/23 0829 84     Resp 01/07/23 0829 20     Temp 01/07/23 0829 98.8 F (37.1 C)     Temp Source 01/07/23 0829 Oral     SpO2 01/07/23 0829 97 %     Weight --      Height --      Head Circumference --  Peak Flow --      Pain Score 01/07/23 0825 2     Pain Loc --      Pain Edu? --      Excl. in [place]? --    No data found.  Updated Vital Signs BP (!) 138/95 (BP Location: Right Arm)   Pulse 84   Temp 98.8 F (37.1 C) (Oral)   Resp 20   SpO2 97%   Visual Acuity Right Eye Distance:  (reports vision is blurry, "dirty" film in left eye.) Left Eye Distance:  (pt reports everything is blurry.) Bilateral Distance: 20/40 (with glasses 20/20.astigmatism in bilateral eyes at baseline.)  Right Eye Near:   Left Eye Near:    Bilateral Near:     Physical Exam Vitals and nursing note reviewed.  Constitutional:      Appearance: Normal appearance.  HENT:     Head: Atraumatic.  Eyes:     General:        Left eye: Discharge present.    Extraocular Movements: Extraocular  movements intact.     Pupils: Pupils are equal, round, and reactive to light.     Comments: left conjunctival injection, erythema, clear drainage Corneal abrasion at 6:00 left eye under fluorescein stain exam  Cardiovascular:     Rate and Rhythm: Normal rate.  Pulmonary:     Effort: Pulmonary effort is normal.  Musculoskeletal:        General: Normal range of motion.     Cervical back: Normal range of motion and neck supple.  Skin:    General: Skin is warm and dry.  Neurological:     Mental Status: He is oriented to person, place, and time.  Psychiatric:        Mood and Affect: Mood normal.        Thought Content: Thought content normal.        Judgment: Judgment normal.    UC Treatments / Results  Labs (all labs ordered are listed, but only abnormal results are displayed) Labs Reviewed - No data to display  EKG   Radiology No results found.  Procedures Procedures (including critical care time)  Medications Ordered in UC Medications - No data to display  Initial Impression / Assessment and Plan / UC Course  I have reviewed the triage vital signs and the nursing notes.  Pertinent labs & imaging results that were available during my care of the patient were reviewed by me and considered in my medical decision making (see chart for details).     Visual acuity at baseline according to patient, corneal abrasion present on fluorescein stain exam and also suspicious for some contact lens conjunctivitis.  Treat with Ocuflox drops, lubricating ointment, ibuprofen as needed.  Follow-up with eye specialist if not resolving.  Avoid contact use until fully resolved.  Final Clinical Impressions(s) / UC Diagnoses   Final diagnoses:  Abrasion of left cornea, initial encounter  Contact lens related conjunctivitis     Discharge Instructions      You may use lubricating eye ointment throughout the day as needed, cool compresses, ibuprofen for pain in addition to the antibiotic  drops.  Wear your glasses until fully resolved to avoid further irritation.  Follow-up with your eye specialist if not resolving    ED Prescriptions     Medication Sig Dispense Auth. Provider   ofloxacin (OCUFLOX) 0.3 % ophthalmic solution Place 1 drop into the left eye 4 (four) times daily. 5 mL Volney American, [place]  PDMP not reviewed this encounter.   Merrie Roof Kincaid, [place] 01/07/23 [phone]

## 2023-01-07 NOTE — ED Triage Notes (Addendum)
Pt reports left eye redness and drainage since Thursday.pt reports " I have a bad habit of falling asleep with contacts in". Pt reports was seen for telehealth yesterday and started on tobramycin drops. Pt denies any improvement in symptoms and reports increase in light sensitivity. Pt currently wearing glasses.

## 2023-06-22 ENCOUNTER — Ambulatory Visit (INDEPENDENT_AMBULATORY_CARE_PROVIDER_SITE_OTHER): Payer: Self-pay

## 2023-06-22 ENCOUNTER — Ambulatory Visit
Admission: RE | Admit: 2023-06-22 | Discharge: 2023-06-22 | Disposition: A | Discharge status: Home / Self Care | Payer: Self-pay | Source: Ambulatory Visit | Attending: Physician Assistant | Admitting: Physician Assistant

## 2023-06-22 VITALS — BP 137/89 | HR 78 | Temp 98.4°F | Resp 19 | Wt 255.0 lb

## 2023-06-22 DIAGNOSIS — F411 Generalized anxiety disorder: Secondary | ICD-10-CM

## 2023-06-22 DIAGNOSIS — R079 Chest pain, unspecified: Secondary | ICD-10-CM

## 2023-06-22 HISTORY — DX: Anxiety disorder, unspecified: F41.9

## 2023-06-22 MED ORDER — HYDROXYZINE HCL 25 MG PO TABS: 25.0000 mg | ORAL_TABLET | Freq: Four times a day (QID) | ORAL | 0 refills | Status: DC | PRN

## 2023-06-22 NOTE — ED Provider Notes (Signed)
[facility]    CSN: [id number] Arrival date & time: 06/22/23  1058      History   Chief Complaint Chief Complaint  Patient presents with   Hypertension    I've been having pressure in my upper chest /shoulder. - Entered by patient    HPI DARE TULIP is a 27 y.o. male presenting for concerns about 4-day history of left-sided chest pressure.  He feels a little discomfort/pressure in his left anterior shoulder as well.  Denies palpitations, dizziness, weakness, presyncope/syncope, shortness of breath or wheezing.  No abdominal pain, nausea or vomiting.  Pain not made worse by breathing or moving.  No increased pain with eating.  Patient denies any injury.  States he sleeps on his left side.  Reports that he vapes.  He also reports generalized anxiety disorder.  States he feels really panicky at times.  Reports a lot of increased stress recently.  States he has recently taken on more job responsibilities.  Also reports that he moved out of his house and into his parents house for the past couple weeks.  Patient reports a family history of mental health conditions.  He does take Zoloft.  He has restarted Zoloft at 50 mg a day over the past 3 days.  He says he was previously taking this and it was helpful for his anxiety.  He denies ever having any associated depression or suicidal ideation.  Medical history significant for anxiety, GERD and IBS.     HPI  Past Medical History:  Diagnosis Date   Anxiety    GERD (gastroesophageal reflux disease)    IBS (irritable bowel syndrome)     Patient Active Problem List   Diagnosis Date Noted   Acute appendicitis 11/13/2019   Acute perforated appendicitis 10/08/2019   Ingrown toenail 02/11/2016   Abdominal pain 10/29/2015    Past Surgical History:  Procedure Laterality Date   FEMUR HARDWARE REMOVAL     FEMUR SURGERY Bilateral    LAPAROSCOPIC APPENDECTOMY N/A 12/02/2019   Procedure: APPENDECTOMY LAPAROSCOPIC;  Surgeon:  Duanne Guess, MD;  Location: [facility];  Service: General;  Laterality: N/A;   WISDOM TOOTH EXTRACTION         Home Medications    Prior to Admission medications   Medication Sig Start Date End Date Taking? Authorizing Provider  hydrOXYzine (ATARAX) 25 MG tablet Take 1 tablet (25 mg total) by mouth every 6 (six) hours as needed for anxiety. 06/22/23  Yes Eusebio Friendly B, PA-C  sertraline (ZOLOFT) 50 MG tablet Take 1 tablet (50 mg total) by mouth daily. 05/26/22  Yes Margaretann Loveless, PA-C  amphetamine-dextroamphetamine (ADDERALL XR) 10 MG 24 hr capsule Take 10 mg by mouth daily.    [provider]  emtricitabine-tenofovir (TRUVADA) 200-300 TABS Prepack Take 1 each by mouth once.    [provider]  Multiple Vitamin (MULTIVITAMIN WITH MINERALS) TABS tablet Take 1 tablet by mouth daily.    [provider]    Family History Family History  Problem Relation Age of Onset   Hypertension Mother    Diabetes Father     Social History Social History   Tobacco Use   Smoking status: Light Smoker    Types: Cigarettes   Smokeless tobacco: Never  Vaping Use   Vaping status: Every Day   Substances: Nicotine, Flavoring, Nicotine-salt  Substance Use Topics   Alcohol use: Yes    Alcohol/week: 1.0 standard drink of alcohol    Types: 1 Shots of  liquor per week   Drug use: Not Currently    Types: Marijuana     Allergies   Patient has no known allergies.   Review of Systems Review of Systems  Constitutional:  Negative for fatigue and fever.  HENT:  Negative for congestion.   Eyes:  Negative for visual disturbance.  Respiratory:  Negative for cough and shortness of breath.   Cardiovascular:  Positive for chest pain. Negative for palpitations.  Gastrointestinal:  Negative for abdominal pain, nausea and vomiting.  Musculoskeletal:  Negative for back pain.  Neurological:  Negative for dizziness, syncope, weakness, numbness and headaches.   Psychiatric/Behavioral:  The patient is nervous/anxious.      Physical Exam Triage Vital Signs ED Triage Vitals  Encounter Vitals Group     BP 06/22/23 1106 (!) 154/91     Systolic BP Percentile --      Diastolic BP Percentile --      Pulse Rate 06/22/23 1106 96     Resp 06/22/23 1106 19     Temp 06/22/23 1106 98.4 F (36.9 C)     Temp Source 06/22/23 1106 Oral     SpO2 06/22/23 1106 (!) 78 %     Weight 06/22/23 1105 255 lb (115.7 kg)     Height --      Head Circumference --      Peak Flow --      Pain Score 06/22/23 1105 4     Pain Loc --      Pain Education --      Exclude from Growth Chart --    No data found.  Updated Vital Signs BP 137/89 (BP Location: Right Arm)   Pulse 78   Temp 98.4 F (36.9 C) (Oral)   Resp 19   Wt 255 lb (115.7 kg)   SpO2 96% Comment: updated  BMI 34.58 kg/m      Physical Exam Vitals and nursing note reviewed.  Constitutional:      General: He is not in acute distress.    Appearance: Normal appearance. He is well-developed. He is not ill-appearing.  HENT:     Head: Normocephalic and atraumatic.     Nose: Nose normal.     Mouth/Throat:     Mouth: Mucous membranes are moist.     Pharynx: Oropharynx is clear.  Eyes:     General: No scleral icterus.    Conjunctiva/sclera: Conjunctivae normal.  Cardiovascular:     Rate and Rhythm: Normal rate and regular rhythm.     Heart sounds: Normal heart sounds.  Pulmonary:     Effort: Pulmonary effort is normal. No respiratory distress.     Breath sounds: Normal breath sounds.  Abdominal:     Palpations: Abdomen is soft.     Tenderness: There is no abdominal tenderness.  Musculoskeletal:     Cervical back: Neck supple.  Skin:    General: Skin is warm and dry.     Capillary Refill: Capillary refill takes less than 2 seconds.  Neurological:     General: No focal deficit present.     Mental Status: He is alert. Mental status is at baseline.     Motor: No weakness.     Gait: Gait  normal.  Psychiatric:        Mood and Affect: Mood is anxious (mildly).      UC Treatments / Results  Labs (all labs ordered are listed, but only abnormal results are displayed) Labs Reviewed - No data to display  EKG   Radiology DG Chest 2 View  Result Date: 06/22/2023 CLINICAL DATA:  left sided chest pain, elevated bp EXAM: CHEST - 2 VIEW COMPARISON:  January 31, 2022 FINDINGS: The cardiomediastinal silhouette is within normal limits. No pleural effusion. No pneumothorax. No mass or consolidation. No acute osseous abnormality. IMPRESSION: No acute findings in the chest. Electronically Signed   By: Olive Bass M.D.   On: 06/22/2023 11:35    Procedures ED EKG  Date/Time: 06/22/2023 12:02 PM  Performed by: Shirlee Latch, PA-C Authorized by: Shirlee Latch, PA-C   Previous ECG:    Previous ECG:  Compared to current   Similarity:  No change Interpretation:    Interpretation: normal   Rate:    ECG rate:  72   ECG rate assessment: normal   Rhythm:    Rhythm: sinus rhythm   Ectopy:    Ectopy: none   QRS:    QRS axis:  Normal   QRS intervals:  Normal   QRS conduction: normal   ST segments:    ST segments:  Normal T waves:    T waves: normal   Comments:     Normal sinus rhythm. Regular rate.  (including critical care time)  Medications Ordered in UC Medications - No data to display  Initial Impression / Assessment and Plan / UC Course  I have reviewed the triage vital signs and the nursing notes.  Pertinent labs & imaging results that were available during my care of the patient were reviewed by me and considered in my medical decision making (see chart for details).   27 year old male with history of anxiety, GERD and IBS presents for left-sided chest pain/pressure x 4 days.  Reports feeling more anxious and panicky over the past few days since taking on new job responsibilities and moving back to his parents house.  Recently restarted Zoloft 50 mg a day for the  past 3 days.  Has an appointment with a PCP on 07/05/2023.  Initial blood pressure 154/91.  Recheck is 137/89.  Other vitals normal and stable.  He is overall well-appearing but mildly anxious.  No acute distress.  On exam heart regular rate and rhythm, chest clear to auscultation and no abdominal tenderness.  EKG performed today is normal.  Regular rhythm and rate of 72 bpm.  Chest x-ray performed as normal.  Discussed these results with patient.  Patient is reporting that chest pressure has relieved since coming to the urgent care and speaking with me.  Offered to perform lab work for patient but he declined stating that he will have it done at his PCP appointment on 07/05/2023.  Advised to continue Zoloft.  Start hydroxyzine for acute anxiety/panic attack.  Discussed managing stressors.  Advised to keep follow-up with psych.  Reviewed going to emergency department if worsening chest pain or any associated shortness of breath, palpitations, weakness, presyncope/syncope, etc.  Patient is agreeable.  We also discussed keeping a log of blood pressure and following up with PCP regarding it.  He reports that he has been losing weight and has lost 20 pounds over the last 7 months.  Encouraged him to keep up with healthy habits to try to avoid blood pressure medication.   Final Clinical Impressions(s) / UC Diagnoses   Final diagnoses:  Chest pain, unspecified type  Generalized anxiety disorder     Discharge Instructions      -EKG and chest x-ray are normal. - Your symptoms are likely related to anxiety.  Continue with the Zoloft.  I sent hydroxyzine as needed for panicky feelings and for sleep. - Keep follow-up appointment with your PCP.  They will likely check routine labs. - Keep checking your blood pressure.  Try to check it when you are feeling most relaxed, after even resting for at least 15 minutes.  Keep a log and take to your PCP appointment.  Your blood pressure did come down to 137/89 which  is in a normal range. - Continue with exercise, weight loss, dietary changes. - Go to the ER if the chest pain worsens or you start to feel racing heart, weakness, shortness of breath, dizziness, feeling faint.     ED Prescriptions     Medication Sig Dispense Auth. Provider   hydrOXYzine (ATARAX) 25 MG tablet Take 1 tablet (25 mg total) by mouth every 6 (six) hours as needed for anxiety. 60 tablet Gareth Morgan      PDMP not reviewed this encounter.   Shirlee Latch, PA-C 06/22/23 [phone]

## 2023-06-22 NOTE — Discharge Instructions (Addendum)
-  EKG and chest x-ray are normal. - Your symptoms are likely related to anxiety.  Continue with the Zoloft.  I sent hydroxyzine as needed for panicky feelings and for sleep. - Keep follow-up appointment with your PCP.  They will likely check routine labs. - Keep checking your blood pressure.  Try to check it when you are feeling most relaxed, after even resting for at least 15 minutes.  Keep a log and take to your PCP appointment.  Your blood pressure did come down to 137/89 which is in a normal range. - Continue with exercise, weight loss, dietary changes. - Go to the ER if the chest pain worsens or you start to feel racing heart, weakness, shortness of breath, dizziness, feeling faint.

## 2023-06-22 NOTE — ED Triage Notes (Addendum)
Pt states his bp has been high. Started checking bp 4 days ago. Pt does have anxiety. He feels a pulling in his left side of his chest and under his arm. He vapes. Has stressors. Doesn't take anxiety meds as prescribed. He just started back taking Zoloft 3 days ago.

## 2023-07-05 ENCOUNTER — Ambulatory Visit: Payer: Self-pay | Admitting: Family Medicine

## 2023-07-07 ENCOUNTER — Other Ambulatory Visit: Payer: Self-pay

## 2023-07-07 ENCOUNTER — Emergency Department (HOSPITAL_COMMUNITY)
Admission: EM | Admit: 2023-07-07 | Discharge: 2023-07-08 | Disposition: A | Discharge status: Home / Self Care | Payer: Self-pay | Attending: Emergency Medicine | Admitting: Emergency Medicine

## 2023-07-07 ENCOUNTER — Encounter (HOSPITAL_COMMUNITY): Payer: Self-pay | Admitting: Emergency Medicine

## 2023-07-07 ENCOUNTER — Emergency Department (HOSPITAL_COMMUNITY): Payer: Self-pay

## 2023-07-07 DIAGNOSIS — R072 Precordial pain: Secondary | ICD-10-CM | POA: Insufficient documentation

## 2023-07-07 DIAGNOSIS — R002 Palpitations: Secondary | ICD-10-CM | POA: Insufficient documentation

## 2023-07-07 LAB — BASIC METABOLIC PANEL
Anion gap: 8 (ref 5–15)
BUN: 11 mg/dL (ref 6–20)
CO2: 27 mmol/L (ref 22–32)
Calcium: 8.6 mg/dL — ABNORMAL LOW (ref 8.9–10.3)
Chloride: 103 mmol/L (ref 98–111)
Creatinine, Ser: 1 mg/dL (ref 0.61–1.24)
GFR, Estimated: >60 mL/min (ref >60)
Glucose, Bld: 98 mg/dL (ref 70–99)
Potassium: 3.6 mmol/L (ref 3.5–5.1)
Sodium: 138 mmol/L (ref 135–145)

## 2023-07-07 LAB — CBC
HCT: 47.4 % (ref 39.0–52.0)
Hemoglobin: 16.1 g/dL (ref 13.0–17.0)
MCH: 30.4 pg (ref 26.0–34.0)
MCHC: 34 g/dL (ref 30.0–36.0)
MCV: 89.6 fL (ref 80.0–100.0)
Platelets: 215 10*3/uL (ref 150–400)
RBC: 5.29 MIL/uL (ref 4.22–5.81)
RDW: 11.9 % (ref 11.5–15.5)
WBC: 6.8 10*3/uL (ref 4.0–10.5)
nRBC: 0 % (ref 0.0–0.2)

## 2023-07-07 LAB — TROPONIN I (HIGH SENSITIVITY): Troponin I (High Sensitivity): 3 ng/L (ref <18)

## 2023-07-07 NOTE — ED Triage Notes (Signed)
Pt here with c/o CP x 2 weeks. States pain is a tightness to L upper chest area and a sharp pain under L breast. Today, pt reports he became "clammy" and checked his BP and it was 156/108.

## 2023-07-08 LAB — TROPONIN I (HIGH SENSITIVITY): Troponin I (High Sensitivity): 3 ng/L (ref <18)

## 2023-07-08 MED ORDER — LORAZEPAM 0.5 MG PO TABS: 0.5000 mg | ORAL_TABLET | Freq: Three times a day (TID) | ORAL | 0 refills | Status: DC | PRN

## 2023-07-08 NOTE — ED Provider Notes (Signed)
[place] EMERGENCY DEPARTMENT AT [facility] Provider Note   CSN: [id number] Arrival date & time: 07/07/23  2202     History  Chief Complaint  Patient presents with   Chest Pain    Johnny Stein is a 27 y.o. male.  The history is provided by the patient and a parent.  Patient presents for an episode of palpitations and chest pain. Patient reports several hours ago he started having left-sided sharp pain that is brief as well as feeling his heart rate increase he reports his heart rate was around 118 and his blood pressure was elevated.  He does not take daily blood pressure medicines.  He reports feeling anxious.  His symptoms are now improved.  No fevers or vomiting.  No recent travel or surgery.  No history of CAD/VTE.  He does vape and drink caffeine.  He reports poor sleep and recent anxiety with moving locations and his job at [employer] No previous history of A-fib or other irregular heart rhythm  He has been seen previously, but no cardiology referral  He just started retaking Zoloft several weeks ago.  He has been given hydroxyzine for anxiety without relief  Home Medications Prior to Admission medications   Medication Sig Start Date End Date Taking? Authorizing Provider  amphetamine-dextroamphetamine (ADDERALL XR) 10 MG 24 hr capsule Take 10 mg by mouth daily.    [provider]  hydrOXYzine (ATARAX) 25 MG tablet Take 1 tablet (25 mg total) by mouth every 6 (six) hours as needed for anxiety. 06/22/23   Shirlee Latch, PA-C  Multiple Vitamin (MULTIVITAMIN WITH MINERALS) TABS tablet Take 1 tablet by mouth daily.    [provider]  sertraline (ZOLOFT) 50 MG tablet Take 1 tablet (50 mg total) by mouth daily. 05/26/22   Margaretann Loveless, PA-C      Allergies    Patient has no known allergies.    Review of Systems   Review of Systems  Respiratory:  Positive for shortness of breath.   Cardiovascular:  Positive for chest pain and  palpitations. Negative for leg swelling.  Neurological:  Negative for syncope.  Psychiatric/Behavioral:  Positive for sleep disturbance. The patient is nervous/anxious.     Physical Exam Updated Vital Signs BP 134/89   Pulse 70   Temp 98.6 F (37 C) (Oral)   Resp 16   Ht 1.829 m (6')   Wt 115.7 kg   SpO2 96%   BMI 34.58 kg/m  Physical Exam CONSTITUTIONAL: Well developed/well nourished, mildly anxious HEAD: Normocephalic/atraumatic EYES: EOMI/PERRL ENMT: Mucous membranes moist NECK: supple no meningeal signs, no obvious thyromegaly SPINE/BACK:entire spine nontender CV: S1/S2 noted, no murmurs/rubs/gallops noted LUNGS: Lungs are clear to auscultation bilaterally, no apparent distress ABDOMEN: soft, nontender, no rebound or guarding, bowel sounds noted throughout abdomen GU:no cva tenderness NEURO: Pt is awake/alert/appropriate, moves all extremitiesx4.  No facial droop.   EXTREMITIES: pulses normal/equal, full ROM, no lower extremity edema or tenderness or erythema SKIN: warm, color normal PSYCH: Anxious  ED Results / Procedures / Treatments   Labs (all labs ordered are listed, but only abnormal results are displayed) Labs Reviewed  BASIC METABOLIC PANEL - Abnormal; Notable for the following components:      Result Value   Calcium 8.6 (*)    All other components within normal limits  CBC  TROPONIN I (HIGH SENSITIVITY)  TROPONIN I (HIGH SENSITIVITY)    EKG EKG Interpretation Date/Time:  Friday July 07 2023 22:38:52 EDT Ventricular Rate:  68 PR Interval:  148 QRS Duration:  93 QT Interval:  368 QTC Calculation: 392 R Axis:   75  Text Interpretation: Sinus rhythm No significant change since last tracing Confirmed by Zadie Rhine ([id number]) on 07/07/2023 11:55:54 PM  Radiology DG Chest Portable 1 View  Result Date: 07/07/2023 CLINICAL DATA:  Chest pain. EXAM: PORTABLE CHEST 1 VIEW COMPARISON:  Chest radiograph dated 06/22/2023. FINDINGS: The heart size and  mediastinal contours are within normal limits. Both lungs are clear. The visualized skeletal structures are unremarkable. IMPRESSION: No active disease. Electronically Signed   By: Elgie Collard M.D.   On: 07/07/2023 22:50    Procedures Procedures    Medications Ordered in ED Medications - No data to display  ED Course/ Medical Decision Making/ A&P             HEART Score: 1                    Medical Decision Making Amount and/or Complexity of Data Reviewed Labs: ordered. Radiology: ordered.   This patient presents to the ED for concern of palpitations, this involves an extensive number of treatment options, and is a complaint that carries with it a high risk of complications and morbidity.  The differential diagnosis includes but is not limited to atrial fibrillation, atrial flutter, sinus tachycardia, SVT, WPW, ventricular tachycardia  Comorbidities that complicate the patient evaluation: Patient's presentation is complicated by their history of anxiety  Social Determinants of Health: Patient's impaired access to primary care  increases the complexity of managing their presentation  Additional history obtained: Additional history obtained from family Records reviewed  urgent care notes  Lab Tests: I Ordered, and personally interpreted labs.  The pertinent results include: Labs overall unremarkable  Imaging Studies ordered: I ordered imaging studies including X-ray chest   I independently visualized and interpreted imaging which showed no acute findings I agree with the radiologist interpretation  Cardiac Monitoring: The patient was maintained on a cardiac monitor.  I personally viewed and interpreted the cardiac monitor which showed an underlying rhythm of:  sinus rhythm  Test Considered: Patient is low risk / negative by heart score, therefore do not feel that cardiac admission is indicated. Patient appears PERC negative will defer PE workup  Reevaluation: After  the interventions noted above, I reevaluated the patient and found that they have :improved  Complexity of problems addressed: Patient's presentation is most consistent with  acute presentation with potential threat to life or bodily function  Disposition: After consideration of the diagnostic results and the patient's response to treatment,  I feel that the patent would benefit from discharge   .   Patient with recurrent palpitations and brief sharp chest pain.  He has never been seen as an outpatient by cardiology.  Will refer to cardiology for blood pressure management and palpitations.  Advised to cut back on caffeine and vaping.  He has had previous workup for a D-dimer that was negative as well as thyroid workup with a normal TSH  Patient does report frequent episodes of anxiety.  Will also give short course of benzodiazepine. At this point he is safe for discharge home        Final Clinical Impression(s) / ED Diagnoses Final diagnoses:  Precordial pain  Palpitations    Rx / DC Orders ED Discharge Orders          Ordered    Ambulatory referral to Cardiology        07/08/23  0130              Zadie Rhine, MD 07/08/23 [id number]

## 2023-09-30 IMAGING — CR DG CHEST 2V
1 series · 2 of 2 positions shown · non-contrast
Comparison: None.

CLINICAL DATA: Chest pain

EXAM:
CHEST - 2 VIEW

[Series 1: dg chest 2 view · 0.14mm/px · 2 of 2 slices shown]
[im 1/2]
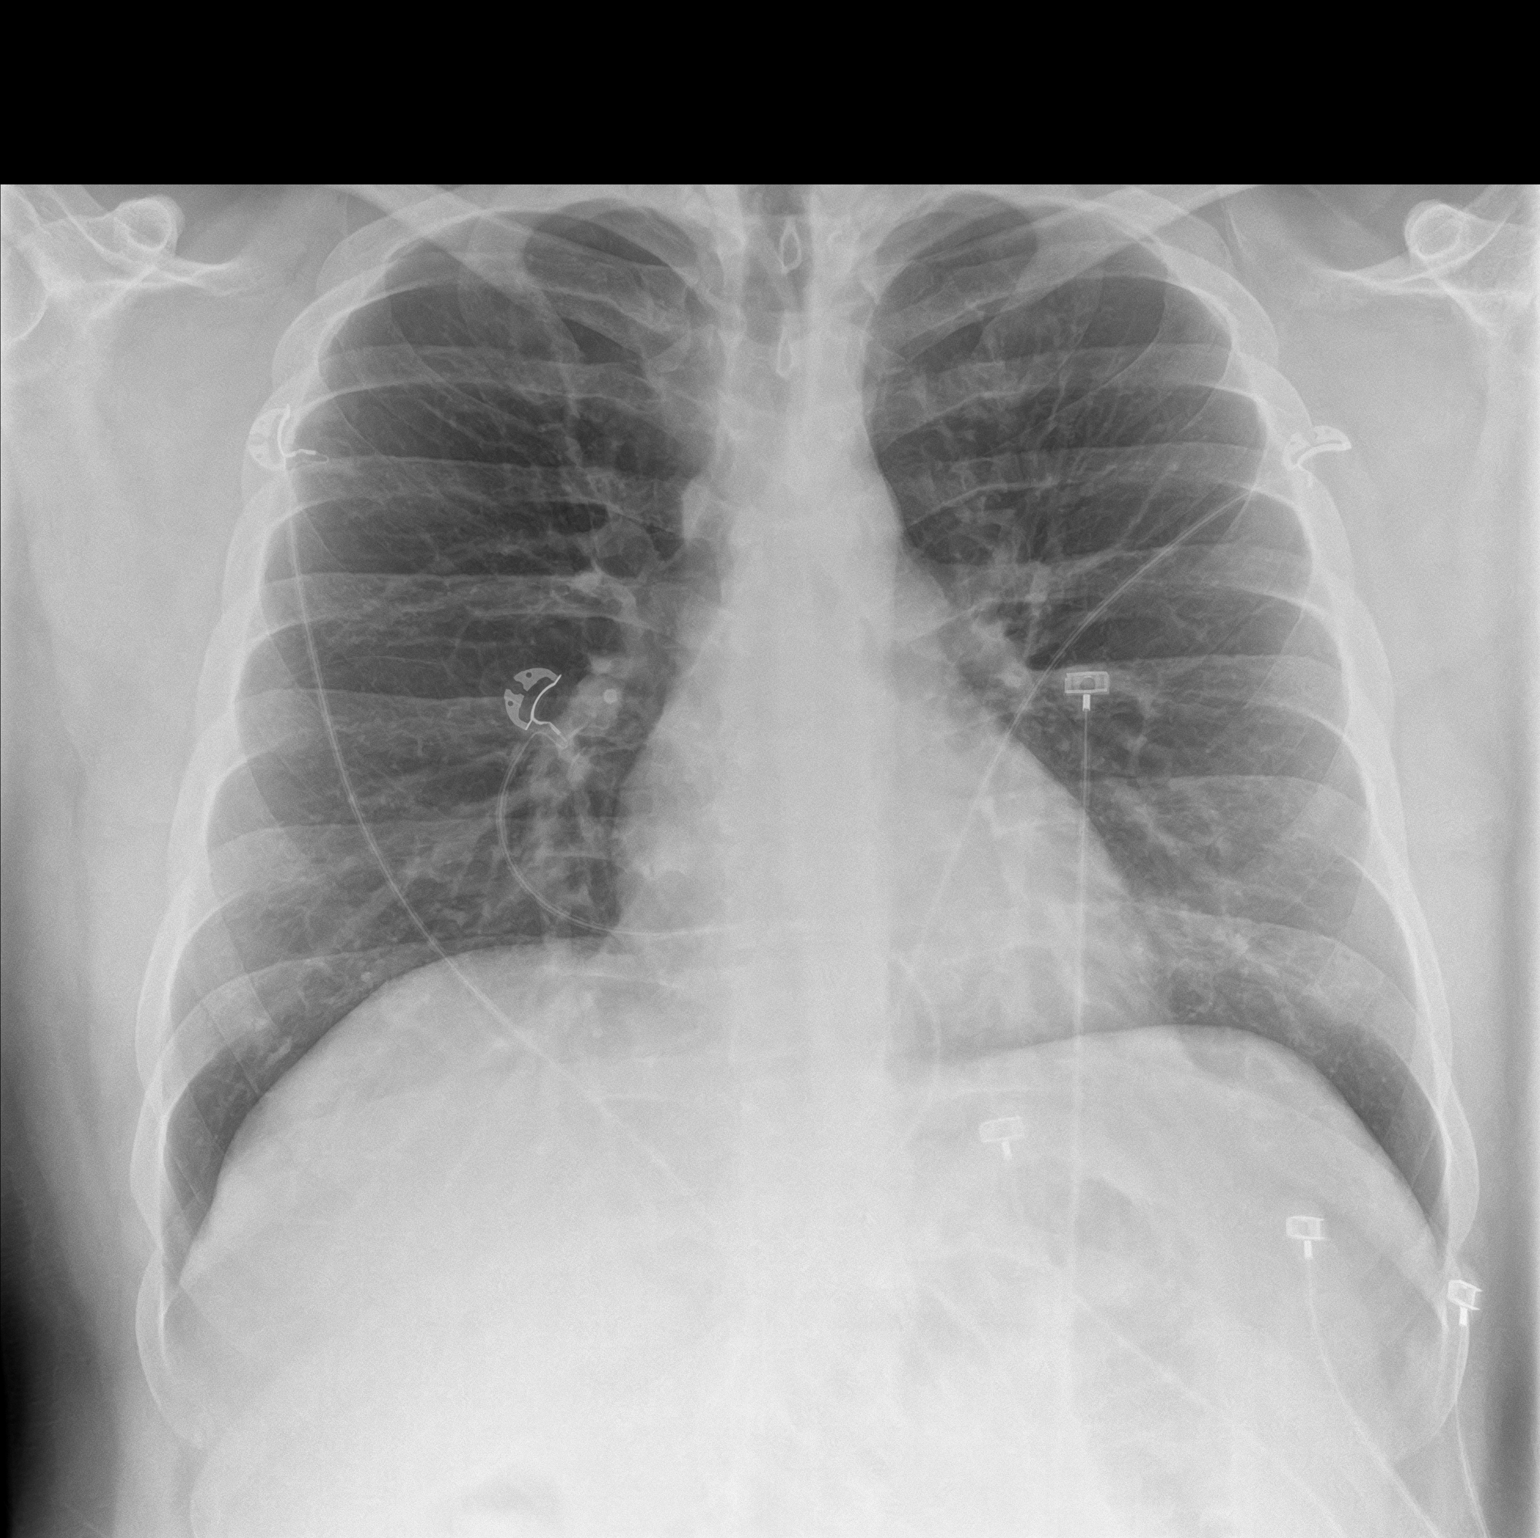
[im 2/2]
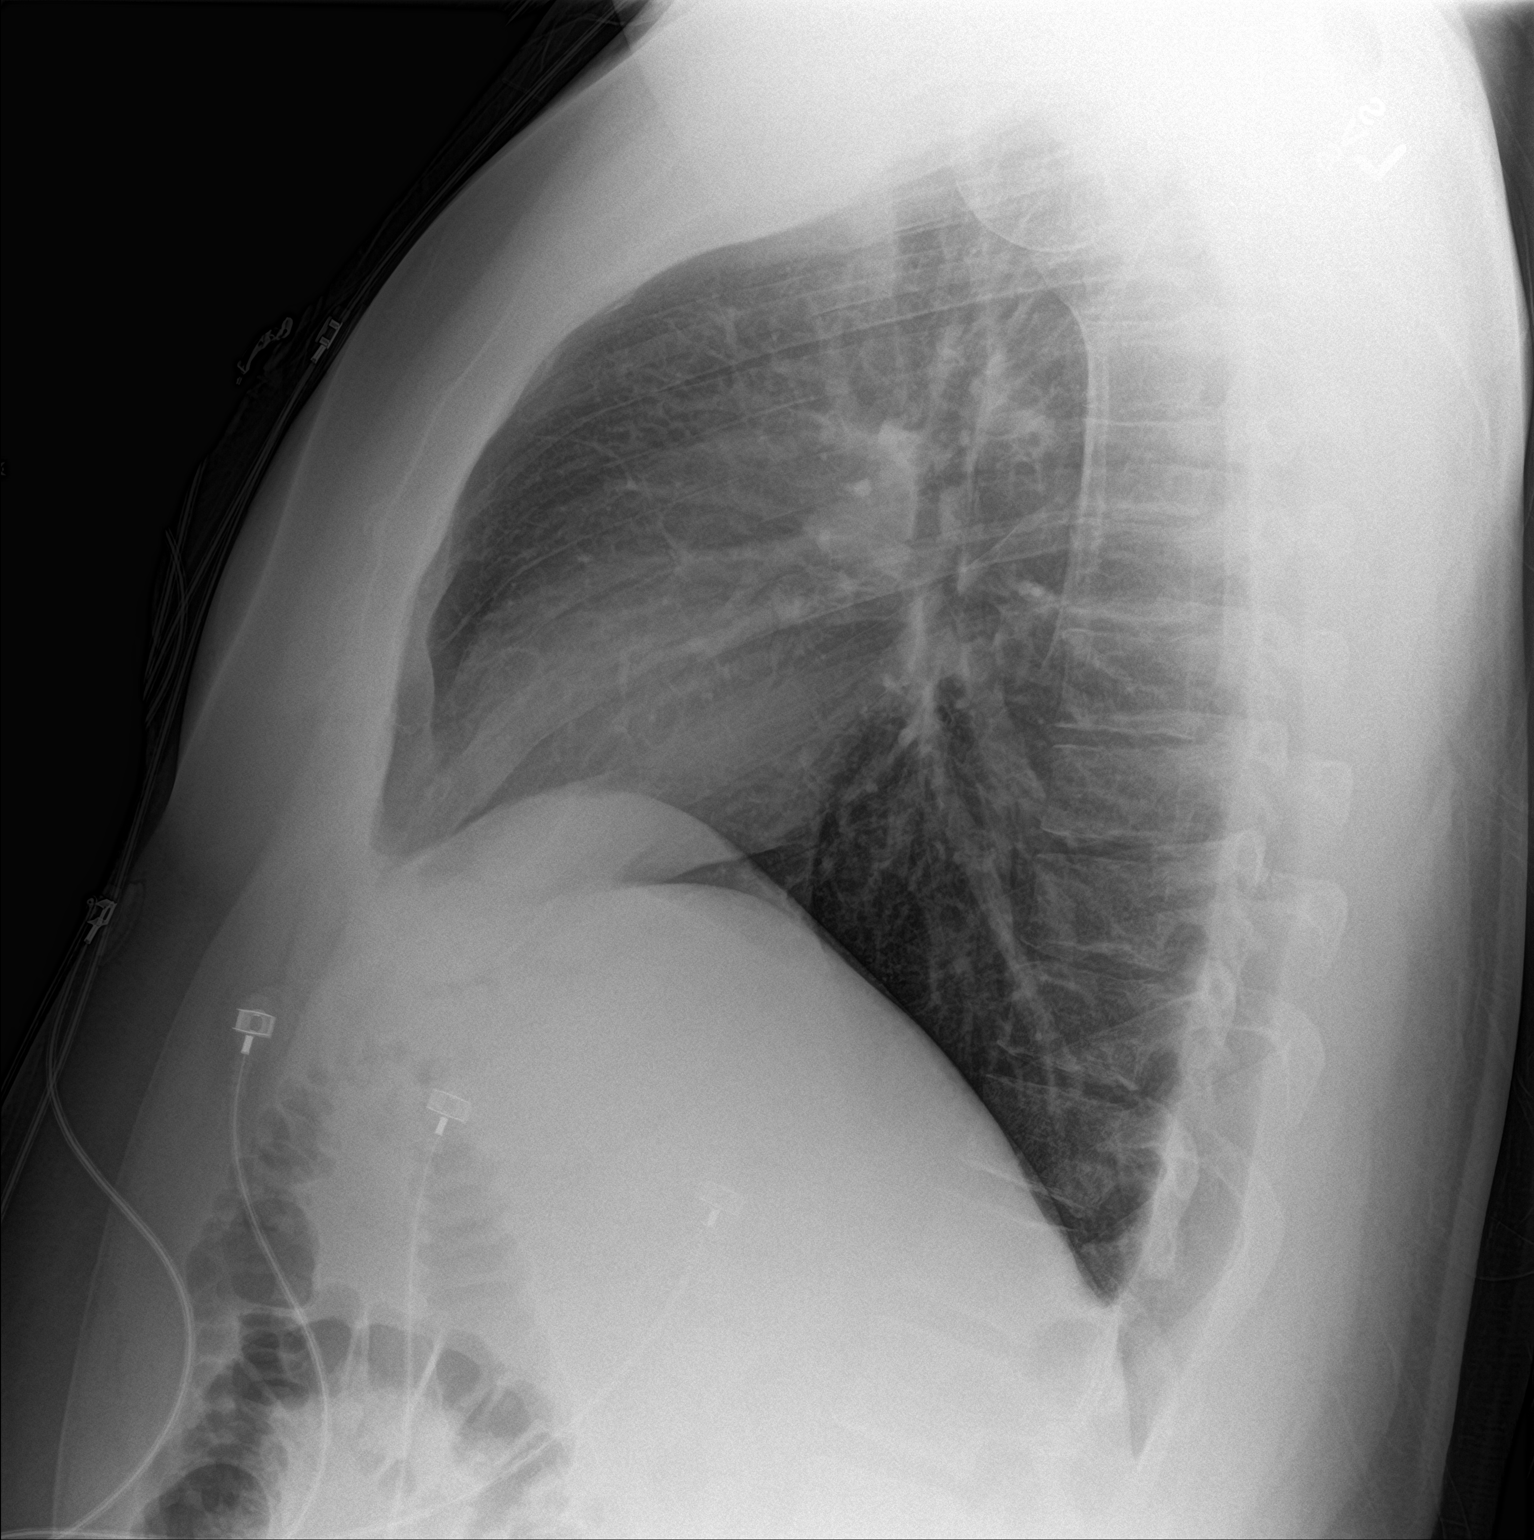

[2 of 2 positions shown; findings below may reference images not displayed]

FINDINGS: The heart size and mediastinal contours are within normal limits.
Both lungs are clear. The visualized skeletal structures are
unremarkable.
IMPRESSION: No active cardiopulmonary disease.

## 2024-11-21 ENCOUNTER — Ambulatory Visit: Payer: Self-pay | Admitting: Family Medicine

## 2024-11-26 ENCOUNTER — Ambulatory Visit: Payer: Self-pay | Admitting: Family Medicine

## 2024-11-26 ENCOUNTER — Encounter: Payer: Self-pay | Admitting: Family Medicine

## 2024-11-26 VITALS — BP 145/105 | HR 90 | Resp 16 | Ht 72.0 in | Wt 280.0 lb

## 2024-11-26 DIAGNOSIS — Z7689 Persons encountering health services in other specified circumstances: Secondary | ICD-10-CM | POA: Diagnosis not present

## 2024-11-26 DIAGNOSIS — F331 Major depressive disorder, recurrent, moderate: Secondary | ICD-10-CM | POA: Diagnosis not present

## 2024-11-26 DIAGNOSIS — I10 Essential (primary) hypertension: Secondary | ICD-10-CM

## 2024-11-26 DIAGNOSIS — Z23 Encounter for immunization: Secondary | ICD-10-CM | POA: Diagnosis not present

## 2024-11-26 DIAGNOSIS — F419 Anxiety disorder, unspecified: Secondary | ICD-10-CM | POA: Diagnosis not present

## 2024-11-26 DIAGNOSIS — Z0000 Encounter for general adult medical examination without abnormal findings: Secondary | ICD-10-CM

## 2024-11-26 MED ORDER — LORAZEPAM 0.5 MG PO TABS: 0.5000 mg | ORAL_TABLET | Freq: Three times a day (TID) | ORAL | 0 refills | Status: AC | PRN

## 2024-11-26 MED ORDER — AMLODIPINE BESYLATE 2.5 MG PO TABS: 2.5000 mg | ORAL_TABLET | Freq: Every day | ORAL | 2 refills | Status: DC

## 2024-11-26 NOTE — Patient Instructions (Signed)

## 2024-11-26 NOTE — Progress Notes (Signed)
 "  New Patient Office Visit  Subjective   Patient ID: Johnny Stein, male    DOB: January 16, 1996  Age: 29 y.o. MRN: [id number]  CC:  Chief Complaint  Patient presents with   Establish Care   Discussed the use of AI scribe software for clinical note transcription with the patient, who gave verbal consent to proceed.  History of Present Illness   Johnny Stein is a 29 year old male who presents to establish with [facility] at [facility]. He has a history of anxiety and elevated blood pressure readings in the past, but has not been diagnosed with hypertension. Previous primary care provider was at [facility].   He has a history of anxiety and consistently elevated blood pressure, often around 149-150/112-113, accompanied by dizziness, tension headaches, and facial flushing. His best  blood pressure was 136/96. He has visited urgent care multiple times for anxiety, chest pain/pressure, and palpitations, describing a sensation of pressure in his chest associated with anxiety. These symptoms have persisted for over a year.  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 He acknowledges that his job contributes to his stress levels. He also mentions a family history of hypertension and anxiety, with his mother and sister experiencing similar issues. His mother has hypertension and experiences side effects from medications, while his sister is on a beta blocker and sees a cardiologist regularly.  He has tried medications such as Zoloft  and Lexapro in the past for anxiety, but did not find them effective. He tried Lexapro but reports he was a zombie while on this medication. He has used lorazepam  as needed, which he finds helpful but is cautious about due to its habit-forming potential. He is currently not on any regular medication for his anxiety or blood pressure.  His lifestyle  includes working at [employer], where he occasionally consumes fast food, but he does not add salt to his meals at home. He is concerned about his weight, noting a recent measurement of 288 pounds, and wants to return to exercising. He also uses nicotine  through vaping, which he is trying to quit.  During the review of symptoms, he reports occasional racing heart, dizziness, and feeling 'off' when his blood pressure is high. No recent chest pain. He does not report significant swelling in his lower legs. He experiences trouble sleeping and often wakes up feeling tired. He does take Unisom OTC for sleep.       BP Readings from Last 3 Encounters:  11/26/24 (!) 145/105  07/08/23 134/89  06/22/23 137/89       11/26/2024    9:01 AM  GAD 7 : Generalized Anxiety Score  Nervous, Anxious, on Edge 2  Control/stop worrying 2  Worry too much - different things 3  Trouble relaxing 2  Restless 2  Easily annoyed or irritable 3  Afraid - awful might happen 0  Total GAD 7 Score 14      11/26/2024    8:59 AM  PHQ9 SCORE ONLY  PHQ-9 Total Score 10   Outpatient Encounter Medications as of 11/26/2024  Medication Sig   amLODipine  (NORVASC ) 2.5 MG tablet Take 1 tablet (2.5 mg total) by mouth daily.   LORazepam  (ATIVAN ) 0.5 MG tablet Take 1 tablet (0.5 mg total) by mouth every 8 (eight) hours as needed for anxiety.   [DISCONTINUED] amphetamine-dextroamphetamine (ADDERALL XR) 10 MG 24 hr capsule Take 10 mg by mouth daily. (Patient  not taking: Reported on 11/26/2024)   [DISCONTINUED] hydrOXYzine  (ATARAX ) 25 MG tablet Take 1 tablet (25 mg total) by mouth every 6 (six) hours as needed for anxiety. (Patient not taking: Reported on 11/26/2024)   [DISCONTINUED] LORazepam  (ATIVAN ) 0.5 MG tablet Take 1 tablet (0.5 mg total) by mouth every 8 (eight) hours as needed for anxiety. (Patient not taking: Reported on 11/26/2024)   [DISCONTINUED] Multiple Vitamin (MULTIVITAMIN WITH MINERALS) TABS tablet Take 1 tablet by mouth  daily. (Patient not taking: Reported on 11/26/2024)   [DISCONTINUED] sertraline  (ZOLOFT ) 50 MG tablet Take 1 tablet (50 mg total) by mouth daily. (Patient not taking: Reported on 11/26/2024)   No facility-administered encounter medications on file as of 11/26/2024.    Patient Active Problem List   Diagnosis Date Noted   Acute appendicitis 11/13/2019   Acute perforated appendicitis 10/08/2019   Ingrown toenail 02/11/2016   Abdominal pain 10/29/2015   Past Medical History:  Diagnosis Date   Anxiety    GERD (gastroesophageal reflux disease)    Hypertension    IBS (irritable bowel syndrome)    Past Surgical History:  Procedure Laterality Date   APPENDECTOMY     FEMUR HARDWARE REMOVAL     FEMUR SURGERY Bilateral    FRACTURE SURGERY     LAPAROSCOPIC APPENDECTOMY N/A 12/02/2019   Procedure: APPENDECTOMY LAPAROSCOPIC;  Surgeon: Marolyn Nest, MD;  Location: [facility] ORS;  Service: General;  Laterality: N/A;   WISDOM TOOTH EXTRACTION     Family History  Problem Relation Age of Onset   Hypertension Mother    Anxiety disorder Mother    Diabetes Father    Anxiety disorder Sister    Social History   Socioeconomic History   Marital status: Single    Spouse name: Not on file   Number of children: Not on file   Years of education: Not on file   Highest education level: 12th grade  Occupational History   Not on file  Tobacco Use   Smoking status: Every Day    Types: E-cigarettes   Smokeless tobacco: Never  Vaping Use   Vaping status: Every Day   Substances: Nicotine , Flavoring, Nicotine -salt  Substance and Sexual Activity   Alcohol use: Yes    Alcohol/week: 4.0 standard drinks of alcohol    Types: 4 Cans of beer per week   Drug use: Not Currently    Types: Marijuana   Sexual activity: Not Currently    Birth control/protection: Condom  Other Topics Concern   Not on file  Social History Narrative   Not on file   Social Drivers of Health   Tobacco Use: High Risk  (11/26/2024)   Patient History    Smoking Tobacco Use: Every Day    Smokeless Tobacco Use: Never    Passive Exposure: Not on file  Financial Resource Strain: Medium Risk (11/25/2024)   Overall Financial Resource Strain (CARDIA)    Difficulty of Paying Living Expenses: Somewhat hard  Food Insecurity: No Food Insecurity (11/25/2024)   Epic    Worried About Programme Researcher, Broadcasting/film/video in the Last Year: Never true    Ran Out of Food in the Last Year: Never true  Transportation Needs: No Transportation Needs (11/25/2024)   Epic    Lack of Transportation (Medical): No    Lack of Transportation (Non-Medical): No  Physical Activity: Insufficiently Active (11/25/2024)   Exercise Vital Sign    Days of Exercise per Week: 2 days    Minutes of Exercise per Session: 20 min  Stress: Stress Concern Present (11/25/2024)   [employer] of Occupational Health - Occupational Stress Questionnaire    Feeling of Stress: Very much  Social Connections: Socially Isolated (11/25/2024)   Social Connection and Isolation Panel    Frequency of Communication with Friends and Family: More than three times a week    Frequency of Social Gatherings with Friends and Family: Once a week    Attends Religious Services: Never    Database Administrator or Organizations: No    Attends Engineer, Structural: Not on file    Marital Status: Never married  Intimate Partner Violence: Not on file  Depression (PHQ2-9): Medium Risk (11/26/2024)   Depression (PHQ2-9)    PHQ-2 Score: 10  Alcohol Screen: Medium Risk (11/25/2024)   Alcohol Screen    Last Alcohol Screening Score (AUDIT): 9  Housing: Low Risk (11/25/2024)   Epic    Unable to Pay for Housing in the Last Year: No    Number of Times Moved in the Last Year: 0    Homeless in the Last Year: No  Utilities: Not on file  Health Literacy: Not on file   Outpatient Medications Prior to Visit  Medication Sig Dispense Refill   amphetamine-dextroamphetamine (ADDERALL XR) 10 MG  24 hr capsule Take 10 mg by mouth daily. (Patient not taking: Reported on 11/26/2024)     hydrOXYzine  (ATARAX ) 25 MG tablet Take 1 tablet (25 mg total) by mouth every 6 (six) hours as needed for anxiety. (Patient not taking: Reported on 11/26/2024) 60 tablet 0   LORazepam  (ATIVAN ) 0.5 MG tablet Take 1 tablet (0.5 mg total) by mouth every 8 (eight) hours as needed for anxiety. (Patient not taking: Reported on 11/26/2024) 5 tablet 0   Multiple Vitamin (MULTIVITAMIN WITH MINERALS) TABS tablet Take 1 tablet by mouth daily. (Patient not taking: Reported on 11/26/2024)     sertraline  (ZOLOFT ) 50 MG tablet Take 1 tablet (50 mg total) by mouth daily. (Patient not taking: Reported on 11/26/2024) 90 tablet 0   No facility-administered medications prior to visit.   Allergies[1]  ROS: see HPI    Objective   Today's Vitals   11/26/24 0856 11/26/24 0907 11/26/24 0908  BP: (!) 149/104 (!) 164/100 (!) 145/105  Pulse: 97 96 90  Resp: 16    SpO2: 98%    Weight: 280 lb (127 kg)    Height: 6' (1.829 m)    PainSc: 0-No pain     GENERAL: Well-appearing, in NAD. Well nourished.  SKIN: Pink, warm and dry. No rash, lesion, ulceration, or ecchymoses.  Head: Normocephalic. NECK: Trachea midline. Full ROM w/o pain or tenderness. No lymphadenopathy.  EARS: Tympanic membranes are intact, translucent without bulging and without drainage. Appropriate landmarks visualized.  EYES: Conjunctiva clear without exudates. EOMI, PERRL, no drainage present.  NOSE: Septum midline w/o deformity. Nares patent, mucosa pink and non-inflamed w/o drainage. No sinus tenderness.  THROAT: Uvula midline. Oropharynx clear. Tonsils non-inflamed without exudate. Mucous membranes pink and moist.  RESPIRATORY: Chest wall symmetrical. Respirations even and non-labored. Breath sounds clear to auscultation bilaterally.  CARDIAC: S1, S2 present, regular rate and rhythm without murmur or gallops. Peripheral pulses 2+ bilaterally.  MSK: Muscle tone  and strength appropriate for age. Joints w/o tenderness, redness, or swelling.  EXTREMITIES: Without clubbing, cyanosis, or edema.  NEUROLOGIC: No motor or sensory deficits. Steady, even gait. C2-C12 intact.  PSYCH/MENTAL STATUS: Alert, oriented x 3. Cooperative, appropriate mood and affect.     Assessment & Plan:  1. Encounter to establish care (Primary) Patient is a 44- year-old male who presents today to establish care with primary care at [facility]. Reviewed the past medical history, family history, social history, surgical history, medications and allergies today- updates made as indicated. Patient has concerns today about the following:   2. Anxiety Chronic anxiety with symptoms of heart palpitations and chest pressure. Previous trials of Zoloft  and Lexapro ineffective or poorly tolerated. Anxiety may contribute to elevated blood pressure. Lorazepam  effective for acute episodes. PDMP reviewed, no red flags present. Prescribed lorazepam  as needed for acute anxiety episodes. Encouraged lifestyle modifications including stress management and regular exercise.   3. MDD (major depressive disorder), recurrent episode, moderate (HCC) PHQ9 completed with score of 10. Patient reports this score is elevated due to issues with sleep. Reports that he has to take Unisom nightly to help him fall asleep and stay asleep. Discussed lifestyle modifications to help manage stress. Declines daily medication at this time for his mood.   4. Primary hypertension Chronic hypertension with consistent readings over 140/90 mmHg. Symptoms include dizziness, tension headaches, and facial flushing. Family history noted. Anxiety considered as a contributing factor. Previous blood work normal with normal troponin levels. Discussed medication management due to history of consistently elevated blood pressure readings. Start amlodipine  2.5 mg daily, prescription sent to pharmacy. Scheduled follow-up in two weeks to assess  blood pressure response. Ordered fasting labs for liver function, kidney function, thyroid , blood count, and cholesterol. - amLODipine  (NORVASC ) 2.5 MG tablet; Take 1 tablet (2.5 mg total) by mouth daily.  Dispense: 30 tablet; Refill: 2  5. Healthcare maintenance Discussed lifestyle factors including diet, exercise, and smoking cessation. Works at [employer] with occasional high-salt food intake. Desires to quit smoking due to family history of respiratory and cardiovascular issues. Discussed smoking cessation strategies. Will obtain fasting blood work today.  - CBC with Differential/Platelet - Comprehensive metabolic panel with GFR - Hemoglobin A1c - Lipid panel - TSH Rfx on Abnormal to Free T4  6. Encounter for immunization Agreeable to receive influenza immunization.  - Flu vaccine trivalent PF, 6mos and older(Flulaval,Afluria,Fluarix,Fluzone)   Return in about 2 weeks (around 12/10/2024) for HTN follow-up.   [name], FNP     [1] No Known Allergies  "

## 2024-11-27 ENCOUNTER — Ambulatory Visit: Payer: Self-pay | Admitting: Family Medicine

## 2024-11-27 LAB — COMPREHENSIVE METABOLIC PANEL WITH GFR
ALT: 36 IU/L (ref 0–44)
AST: 23 IU/L (ref 0–40)
Albumin: 4.7 g/dL (ref 4.3–5.2)
Alkaline Phosphatase: 68 IU/L (ref 47–123)
BUN/Creatinine Ratio: 13 (ref 9–20)
BUN: 12 mg/dL (ref 6–20)
Bilirubin Total: 0.7 mg/dL (ref 0.0–1.2)
CO2: 25 mmol/L (ref 20–29)
Calcium: 9.9 mg/dL (ref 8.7–10.2)
Chloride: 101 mmol/L (ref 96–106)
Creatinine, Ser: 0.93 mg/dL (ref 0.76–1.27)
Globulin, Total: 2.4 g/dL (ref 1.5–4.5)
Glucose: 87 mg/dL (ref 70–99)
Potassium: 4.7 mmol/L (ref 3.5–5.2)
Sodium: 141 mmol/L (ref 134–144)
Total Protein: 7.1 g/dL (ref 6.0–8.5)
eGFR: 114 mL/min/1.73

## 2024-11-27 LAB — CBC WITH DIFFERENTIAL/PLATELET
Basophils Absolute: 0 x10E3/uL (ref 0.0–0.2)
Basos: 0 %
EOS (ABSOLUTE): 0.1 x10E3/uL (ref 0.0–0.4)
Eos: 2 %
Hematocrit: 52.7 % — ABNORMAL HIGH (ref 37.5–51.0)
Hemoglobin: 17.5 g/dL (ref 13.0–17.7)
Immature Grans (Abs): 0 x10E3/uL (ref 0.0–0.1)
Immature Granulocytes: 0 %
Lymphocytes Absolute: 1.2 x10E3/uL (ref 0.7–3.1)
Lymphs: 23 %
MCH: 30.5 pg (ref 26.6–33.0)
MCHC: 33.2 g/dL (ref 31.5–35.7)
MCV: 92 fL (ref 79–97)
Monocytes Absolute: 0.4 x10E3/uL (ref 0.1–0.9)
Monocytes: 8 %
Neutrophils Absolute: 3.6 x10E3/uL (ref 1.4–7.0)
Neutrophils: 67 %
Platelets: 252 x10E3/uL (ref 150–450)
RBC: 5.74 x10E6/uL (ref 4.14–5.80)
RDW: 12.4 % (ref 11.6–15.4)
WBC: 5.4 x10E3/uL (ref 3.4–10.8)

## 2024-11-27 LAB — LIPID PANEL
Chol/HDL Ratio: 5.4 ratio — ABNORMAL HIGH (ref 0.0–5.0)
Cholesterol, Total: 221 mg/dL — ABNORMAL HIGH (ref 100–199)
HDL: 41 mg/dL
LDL Chol Calc (NIH): 154 mg/dL — ABNORMAL HIGH (ref 0–99)
Triglycerides: 144 mg/dL (ref 0–149)
VLDL Cholesterol Cal: 26 mg/dL (ref 5–40)

## 2024-11-27 LAB — HEMOGLOBIN A1C
Est. average glucose Bld gHb Est-mCnc: 105 mg/dL
Hgb A1c MFr Bld: 5.3 % (ref 4.8–5.6)

## 2024-11-27 LAB — TSH RFX ON ABNORMAL TO FREE T4: TSH: 1.35 u[IU]/mL (ref 0.450–4.500)

## 2024-12-10 ENCOUNTER — Ambulatory Visit: Admitting: Family Medicine

## 2024-12-13 ENCOUNTER — Encounter: Payer: Self-pay | Admitting: Family Medicine

## 2024-12-13 ENCOUNTER — Ambulatory Visit: Admitting: Family Medicine

## 2024-12-13 VITALS — BP 142/90 | HR 90 | Resp 16 | Ht 72.0 in | Wt 281.0 lb

## 2024-12-13 DIAGNOSIS — I10 Essential (primary) hypertension: Secondary | ICD-10-CM | POA: Diagnosis not present

## 2024-12-13 DIAGNOSIS — F411 Generalized anxiety disorder: Secondary | ICD-10-CM | POA: Diagnosis not present

## 2024-12-13 MED ORDER — FLUOXETINE HCL 10 MG PO CAPS: 10.0000 mg | ORAL_CAPSULE | Freq: Every day | ORAL | 2 refills | Status: AC

## 2024-12-13 MED ORDER — AMLODIPINE BESYLATE 5 MG PO TABS: 5.0000 mg | ORAL_TABLET | Freq: Every day | ORAL | 2 refills | Status: AC

## 2024-12-13 NOTE — Patient Instructions (Signed)

## 2024-12-13 NOTE — Progress Notes (Unsigned)
" ° °  Established Patient Office Visit  Subjective  Patient ID: Johnny Stein, male    DOB: Mar 18, 1996  Age: 29 y.o. MRN: [id number]  Chief Complaint  Patient presents with   Hypertension   HYPERTENSION: [name] presents for the medical management of hypertension.  Patient's current hypertension medication regimen is: amlodipine  2.5mg  daily  Patient is currently taking prescribed medications for HTN.  Patient is regularly keeping a check on BP at home. 142/106, 130/95, 126/85, 132/90, 142/93, 134/87, 140/96, 117/79 (BP med & Ativan )  Denies headache, dizziness, CP, SHOB, vision changes.    BP Readings from Last 3 Encounters:  12/13/24 (!) 142/90  11/26/24 (!) 145/105  07/08/23 134/89   ANXIETY: [name] presents for the medical management of anxiety.  Current medication regimen:  Ativan  taking daily  Counseling:  Well controlled:  Denies SI/HI.  Has tried zoloft  & Lexapro      12/13/2024   11:31 AM 11/26/2024    9:01 AM  GAD 7 : Generalized Anxiety Score  Nervous, Anxious, on Edge 3 2   Control/stop worrying 2 2   Worry too much - different things 2 3   Trouble relaxing 1 2   Restless 0 2   Easily annoyed or irritable 1 3   Afraid - awful might happen 0 0   Total GAD 7 Score 9 14  Anxiety Difficulty Somewhat difficult      Data saved with a previous flowsheet row definition      12/13/2024   11:25 AM 11/26/2024    8:59 AM  PHQ9 SCORE ONLY  PHQ-9 Total Score 6 10   ROS: see HPI    Objective:    BP (!) 142/90   Pulse 90   Resp 16   Ht 6' (1.829 m)   Wt 281 lb (127.5 kg)   SpO2 97%   BMI 38.11 kg/m  BP Readings from Last 3 Encounters:  12/13/24 (!) 142/90  11/26/24 (!) 145/105  07/08/23 134/89    Physical Exam    Assessment & Plan:   Primary hypertension -     amLODIPine  Besylate; Take 1 tablet (5 mg total) by mouth daily.  Dispense: 30 tablet; Refill: 2  GAD (generalized anxiety disorder) -     FLUoxetine  HCl; Take 1 capsule  (10 mg total) by mouth daily.  Dispense: 30 capsule; Refill: 2  Return in about 4 weeks (around 01/10/2025) for Mood f/u, HTN follow-up.    [name], FNP "

## 2025-01-10 ENCOUNTER — Ambulatory Visit: Admitting: Family Medicine
